# Patient Record
Sex: Female | Born: 1965 | Race: White | Hispanic: No | Smoking: Never smoker
Health system: Southern US, Community
[De-identification: ages and names within clinical notes are randomized; demographics above are authoritative.]

## PROBLEM LIST (undated history)

## (undated) DIAGNOSIS — Z9889 Other specified postprocedural states: Secondary | ICD-10-CM

## (undated) DIAGNOSIS — R112 Nausea with vomiting, unspecified: Secondary | ICD-10-CM

## (undated) DIAGNOSIS — O24419 Gestational diabetes mellitus in pregnancy, unspecified control: Secondary | ICD-10-CM

## (undated) HISTORY — DX: Gestational diabetes mellitus in pregnancy, unspecified control: O24.419

---

## 2004-12-31 ENCOUNTER — Ambulatory Visit: Payer: Self-pay

## 2005-01-08 ENCOUNTER — Ambulatory Visit: Payer: Self-pay

## 2005-02-08 ENCOUNTER — Ambulatory Visit: Payer: Self-pay

## 2005-03-10 ENCOUNTER — Ambulatory Visit: Payer: Self-pay

## 2005-07-10 ENCOUNTER — Inpatient Hospital Stay: Payer: Self-pay | Admitting: Obstetrics & Gynecology

## 2007-12-15 ENCOUNTER — Emergency Department: Payer: Self-pay | Admitting: Emergency Medicine

## 2008-05-17 ENCOUNTER — Emergency Department (HOSPITAL_COMMUNITY): Admission: EM | Admit: 2008-05-17 | Discharge: 2008-05-17 | Payer: Self-pay | Admitting: Emergency Medicine

## 2008-08-16 ENCOUNTER — Inpatient Hospital Stay: Payer: Self-pay | Admitting: Psychiatry

## 2008-09-05 ENCOUNTER — Inpatient Hospital Stay: Payer: Self-pay | Admitting: Psychiatry

## 2008-09-05 ENCOUNTER — Observation Stay: Payer: Self-pay | Admitting: Internal Medicine

## 2009-04-18 IMAGING — CR DG LUMBAR SPINE 2-3V
1 series · 3 of 3 positions shown · non-contrast
Comparison: none

REASON FOR EXAM: lower back pain
COMMENTS:

PROCEDURE:     DXR - DXR LUMBAR SPINE AP AND LATERAL  - December 15, 2007  [DATE]
RESULT:     There does not appear to be evidence of fracture, dislocation or
malalignment.  Degenerative changes are appreciated involving L2-3, L3-4 and
the posterior elements at L5-S1.

[Series 1: view not recorded · 0.17mm/px · 3 of 3 slices shown]
[im 1/3]
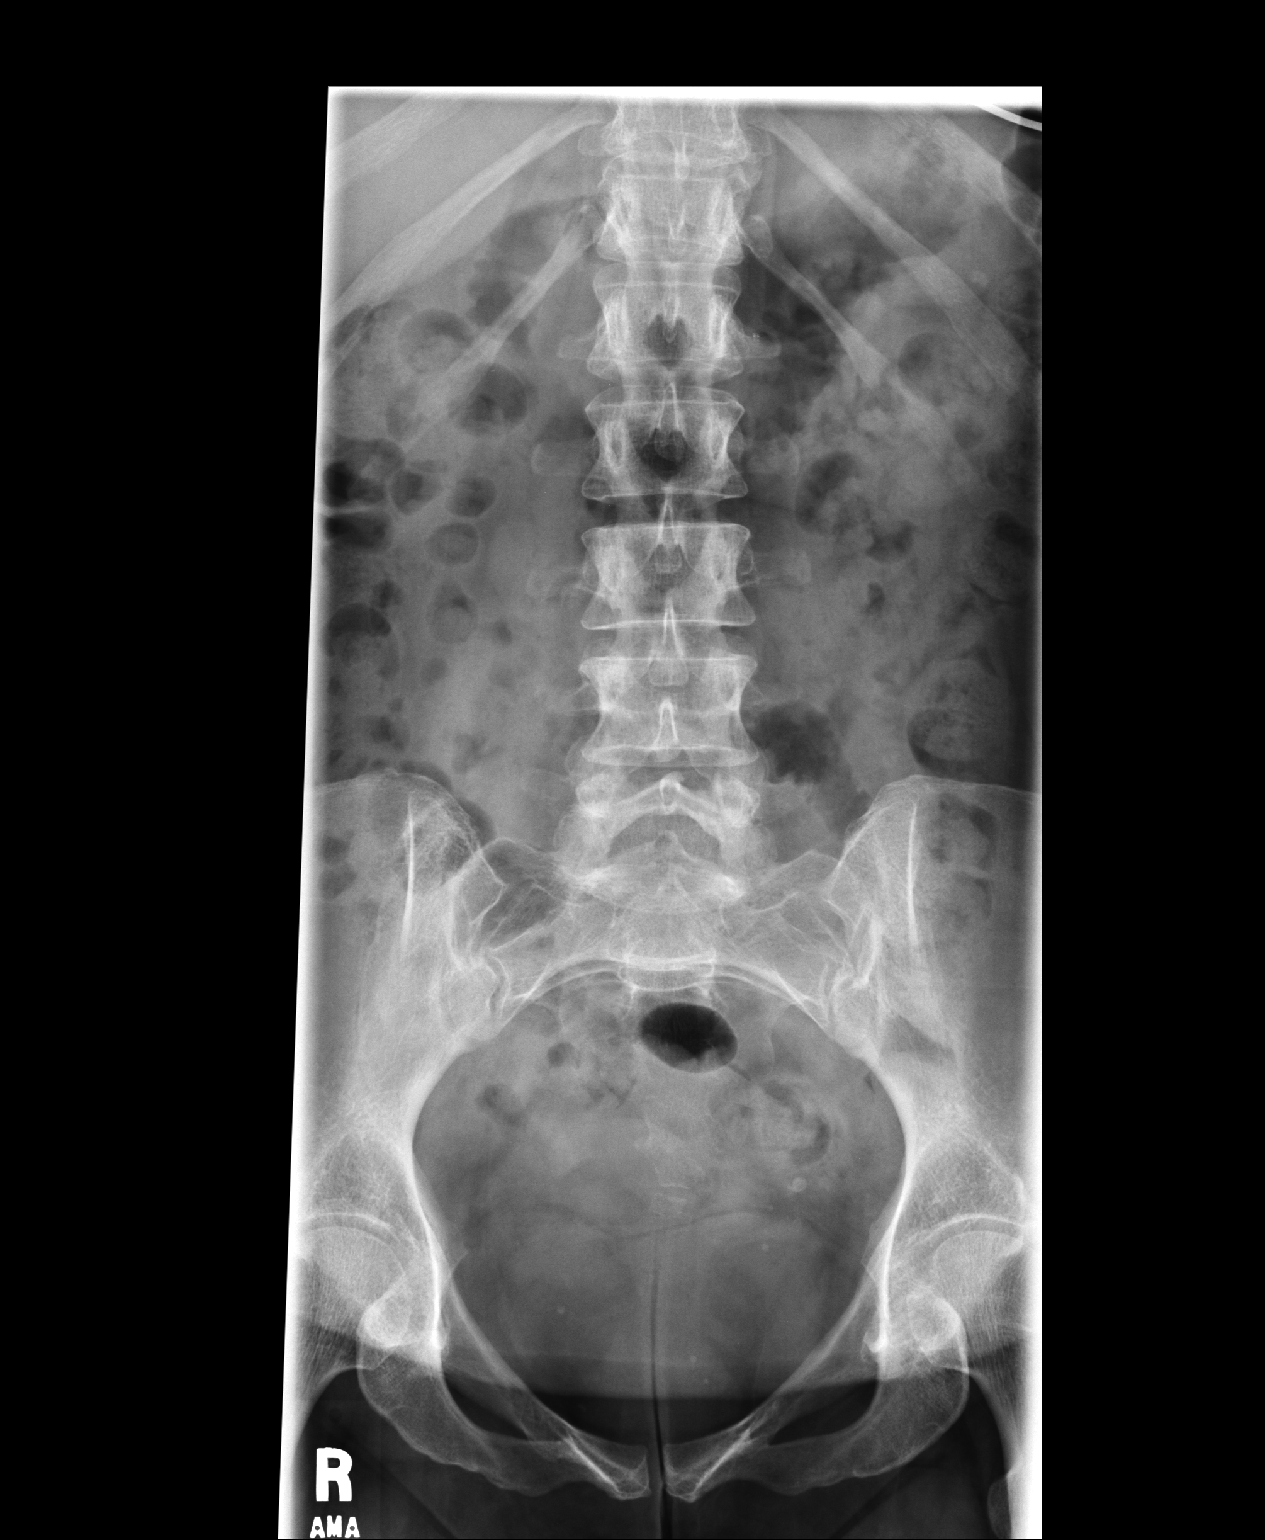
[im 2/3]
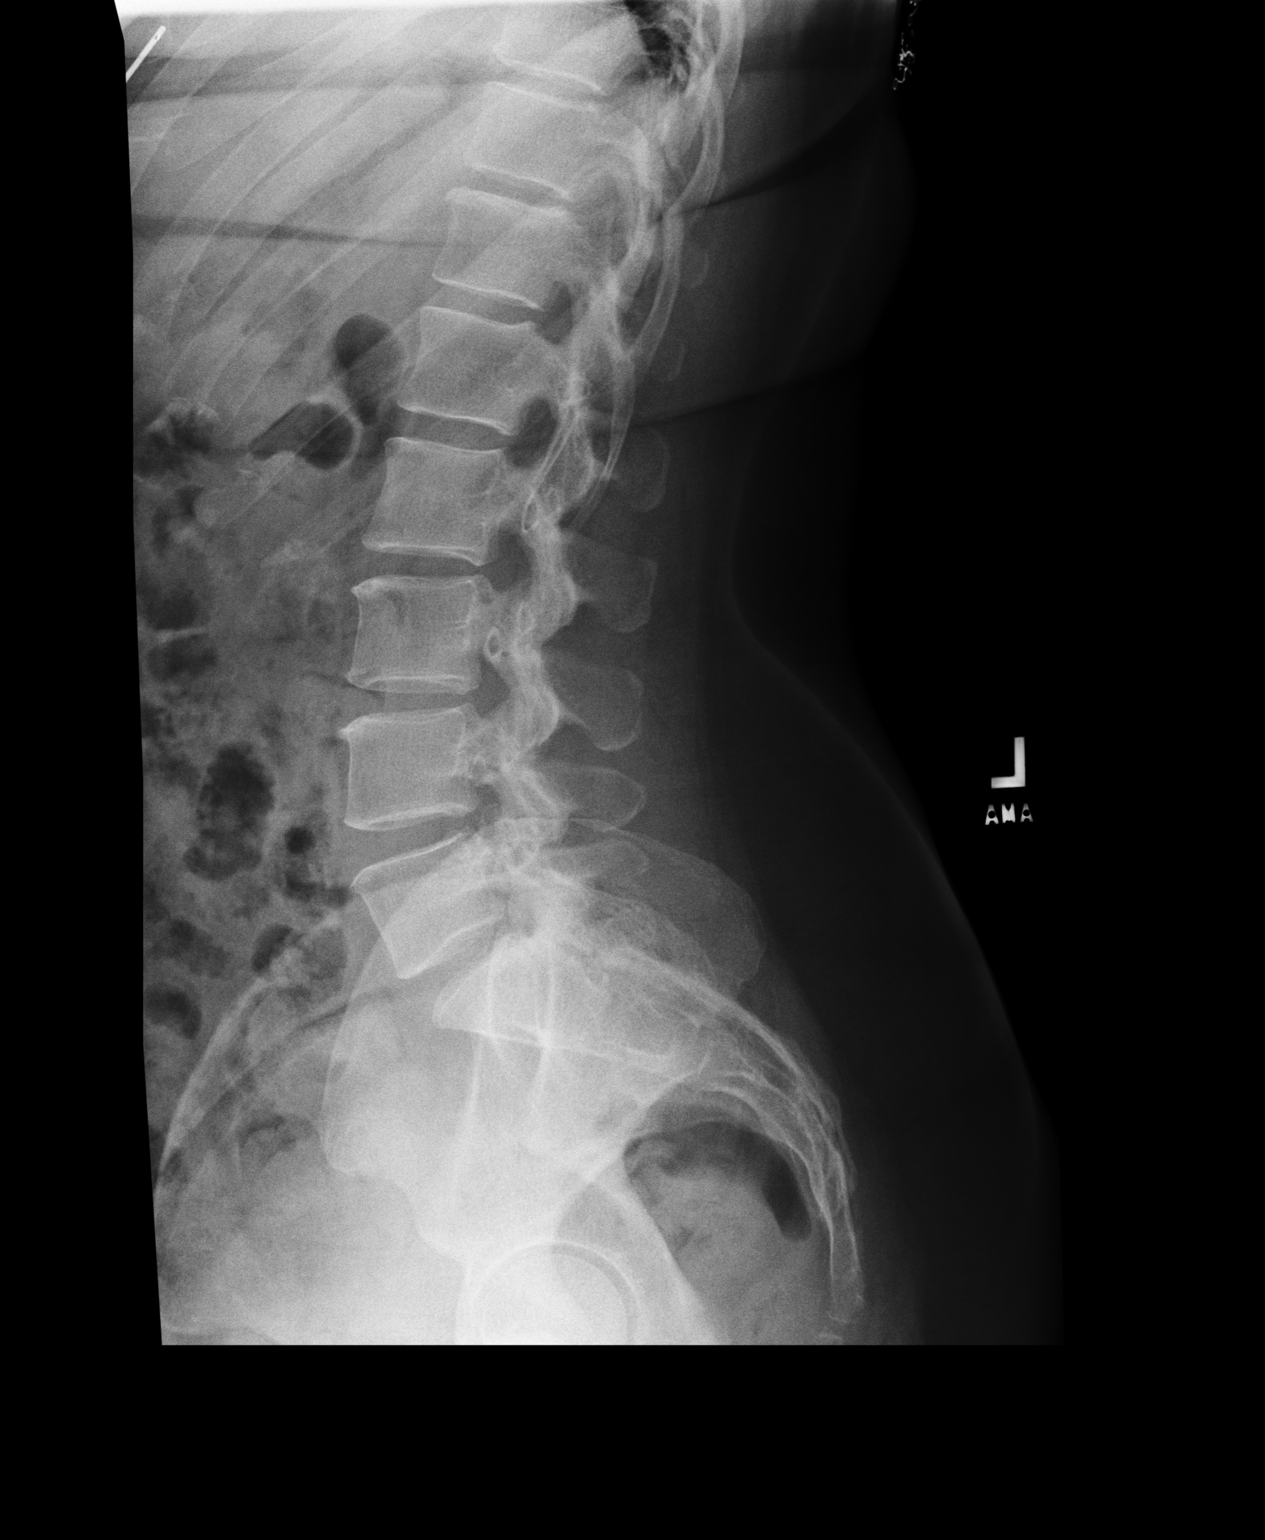
[im 3/3]
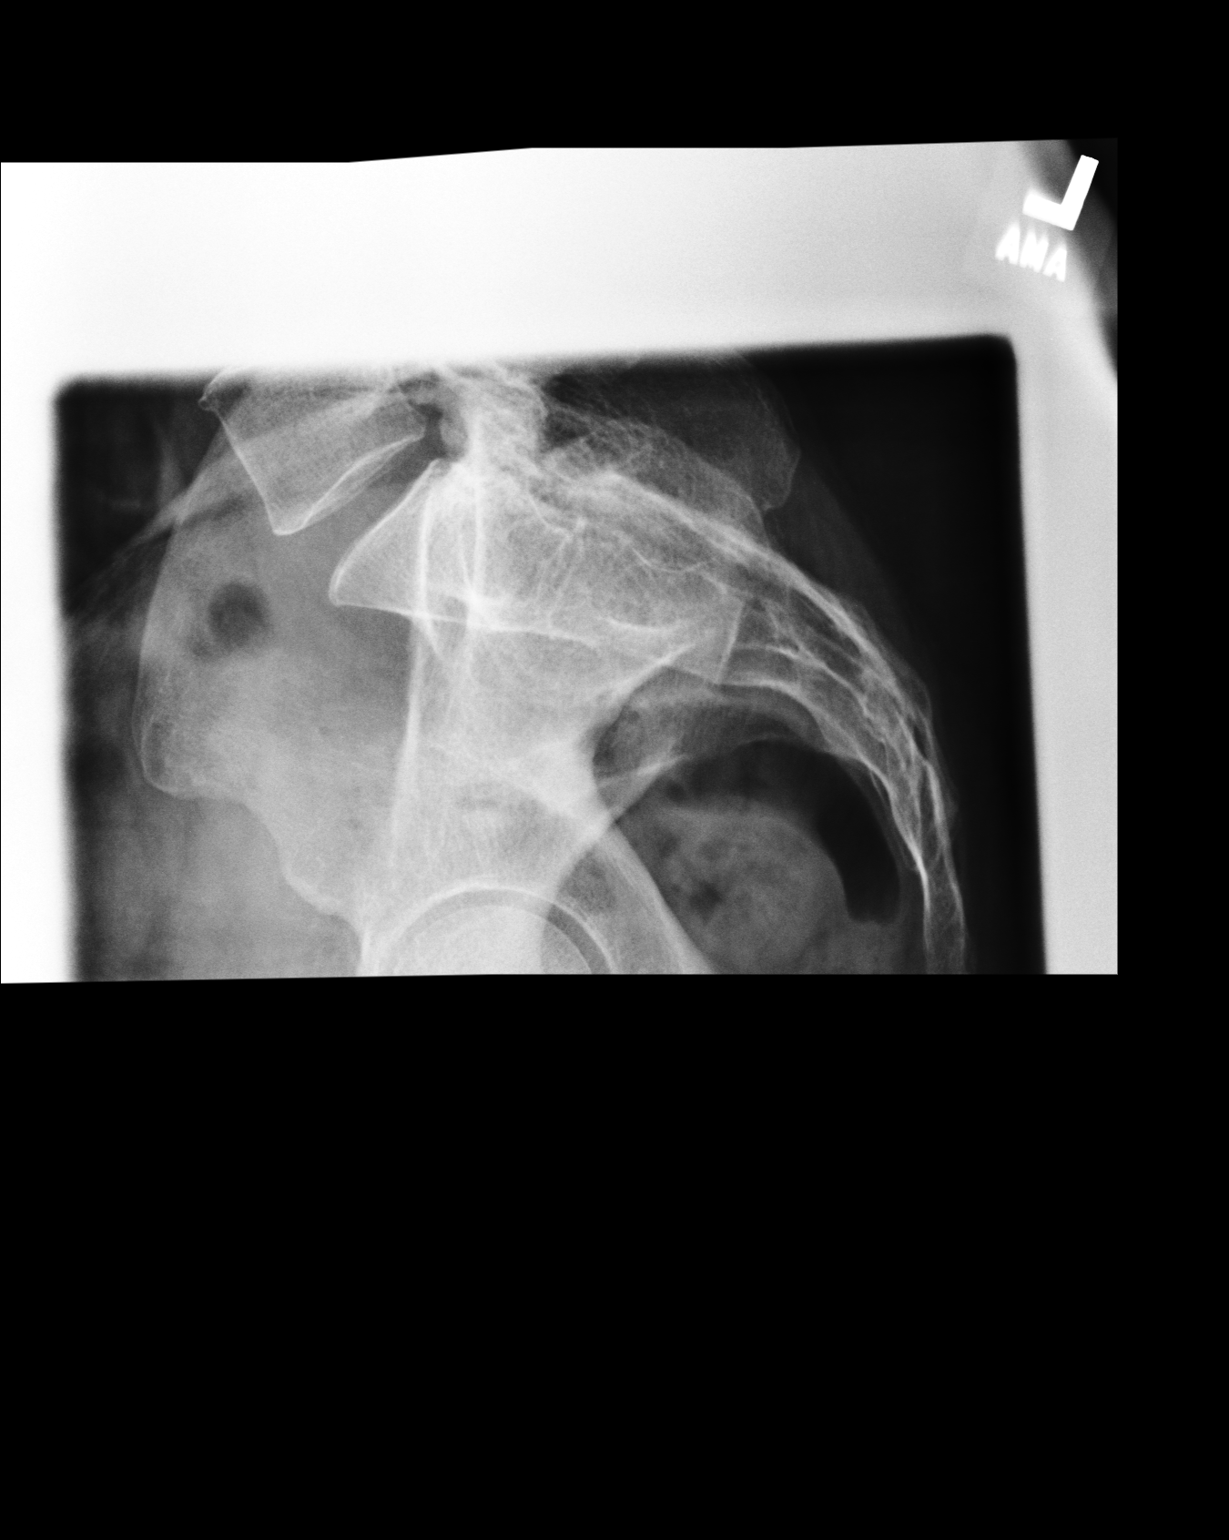

[3 of 3 positions shown; findings below may reference images not displayed]

IMPRESSION: No evidence of acute osseous abnormalities.  Degenerative changes are
appreciated.  If there is persistent clinical concern, further evaluation
with cross sectional imaging is recommended.

## 2009-09-16 ENCOUNTER — Inpatient Hospital Stay: Payer: Self-pay | Admitting: Internal Medicine

## 2009-09-17 ENCOUNTER — Inpatient Hospital Stay: Payer: Self-pay | Admitting: Unknown Physician Specialty

## 2010-09-07 ENCOUNTER — Emergency Department: Payer: Self-pay | Admitting: Unknown Physician Specialty

## 2011-01-19 IMAGING — CR DG CHEST 1V PORT
1 series · 1 of 1 positions shown · non-contrast
Comparison: none

REASON FOR EXAM: overdose
COMMENTS:

[view not recorded]
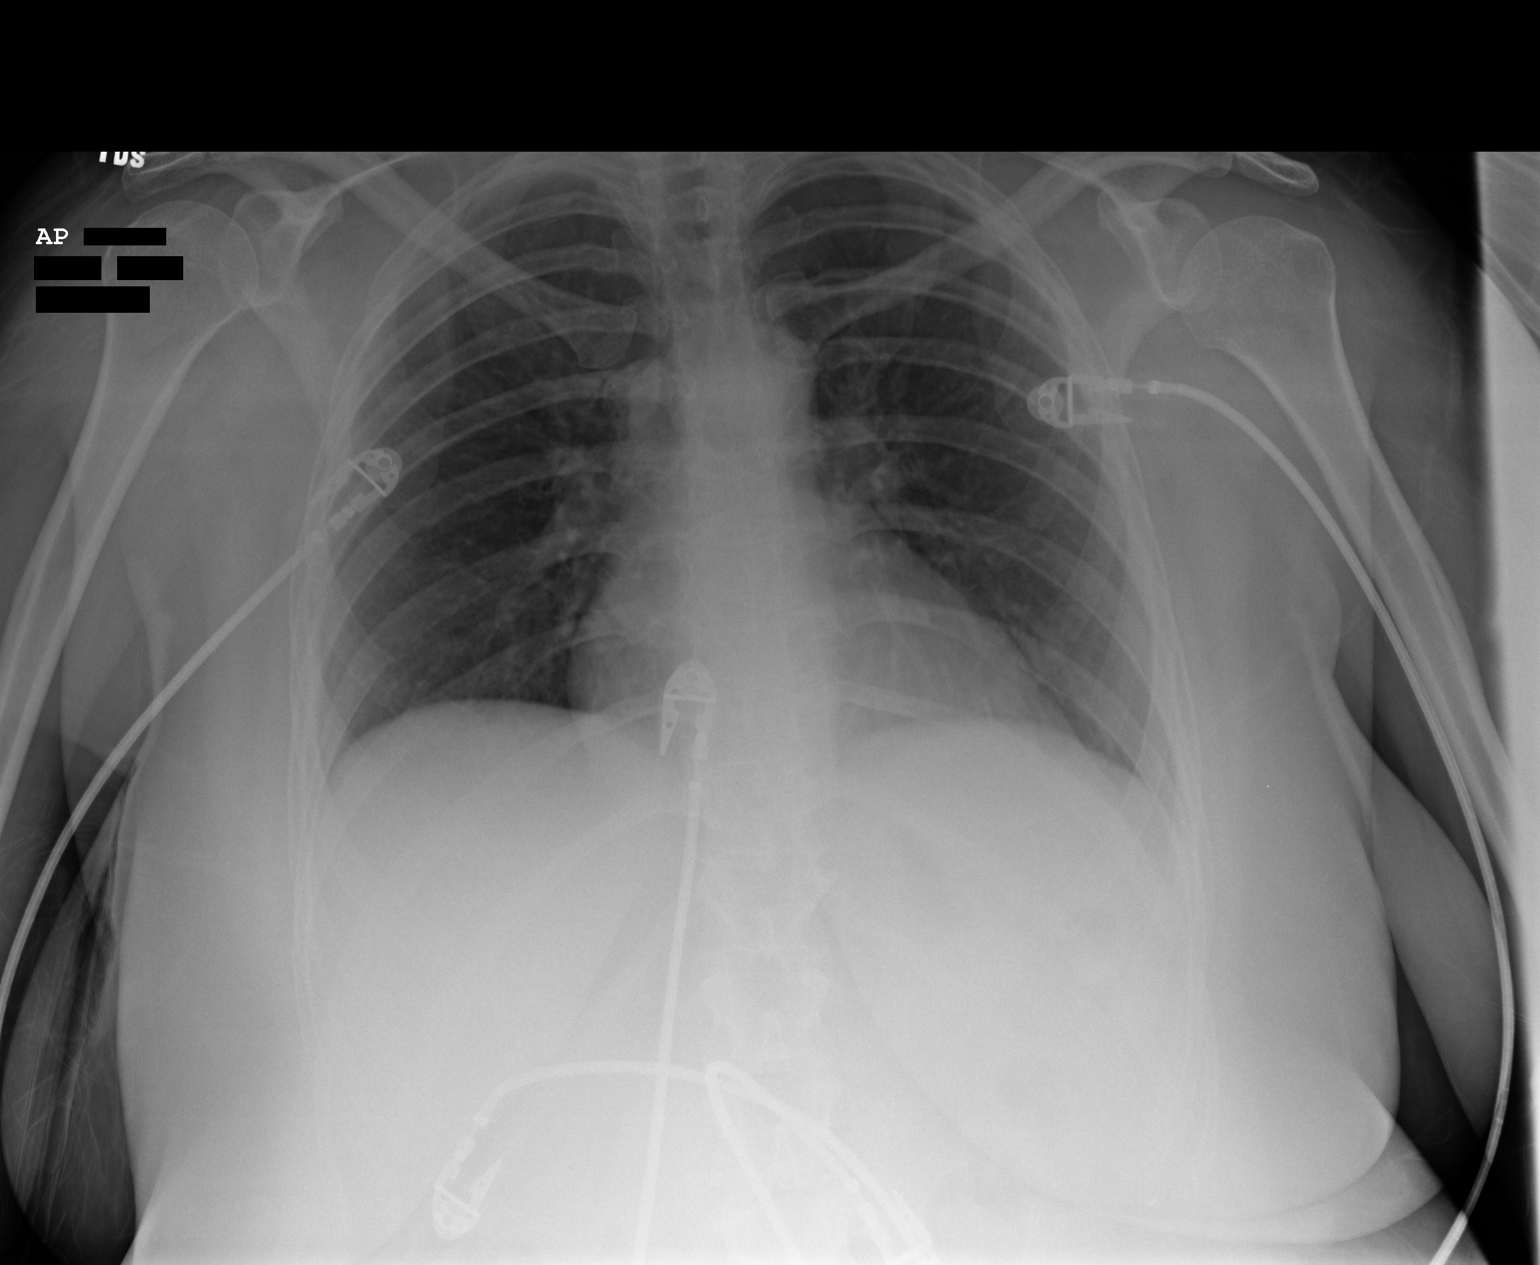

[1 of 1 positions shown; findings below may reference images not displayed]

PROCEDURE:     DXR - DXR PORTABLE CHEST SINGLE VIEW  - September 16, 2009  [DATE]

RESULT:     There is no previous examination for comparison.

Cardiac monitoring electrodes are present. The lungs are clear. The heart
and pulmonary vessels are normal. The bony and mediastinal structures are
unremarkable. There is no effusion. There is no pneumothorax or evidence of
congestive failure.
IMPRESSION: No acute cardiopulmonary disease.

## 2018-07-13 ENCOUNTER — Other Ambulatory Visit: Payer: Self-pay

## 2018-07-13 ENCOUNTER — Encounter: Payer: Self-pay | Admitting: Emergency Medicine

## 2018-07-13 ENCOUNTER — Ambulatory Visit
Admission: EM | Admit: 2018-07-13 | Discharge: 2018-07-13 | Disposition: A | Discharge status: Home / Self Care | Payer: Managed Care, Other (non HMO) | Attending: Family Medicine | Admitting: Family Medicine

## 2018-07-13 DIAGNOSIS — S39012A Strain of muscle, fascia and tendon of lower back, initial encounter: Secondary | ICD-10-CM

## 2018-07-13 DIAGNOSIS — M545 Low back pain: Secondary | ICD-10-CM

## 2018-07-13 MED ORDER — METAXALONE 800 MG PO TABS: 800.0000 mg | ORAL_TABLET | Freq: Three times a day (TID) | ORAL | 0 refills | Status: DC

## 2018-07-13 MED ORDER — KETOROLAC TROMETHAMINE 60 MG/2ML IM SOLN
30.0000 mg | Freq: Once | INTRAMUSCULAR | Status: AC
  Administered 2018-07-13: 30 mg via INTRAMUSCULAR

## 2018-07-13 MED ORDER — NAPROXEN 500 MG PO TABS: 500.0000 mg | ORAL_TABLET | Freq: Two times a day (BID) | ORAL | 0 refills | Status: DC

## 2018-07-13 NOTE — ED Triage Notes (Signed)
Patient c/o lower back pain that started 2 days ago.  Patient denies injury or falls.

## 2018-07-13 NOTE — Discharge Instructions (Signed)
Apply ice 20 minutes out of every 2 hours 4-5 times daily for comfort. Use  Caution while taking muscle relaxers.  Do not perform activities requiring concentration or judgment and do not drive.  Avoid symptoms .  Limit sitting lifting or bending.  Gradually resume activities as you improve

## 2018-07-13 NOTE — ED Provider Notes (Signed)
[facility]    CSN: [id number] Arrival date & time: 07/13/18  1412     History   Chief Complaint Chief Complaint  Patient presents with  . Back Pain    HPI Judith Barnes is a 52 y.o. female.   HPI  52 year old female presents with right-sided lower back pain indicating the sacroiliac area started about 2 days ago.  She does not remember any specific injury or fall that may account for her symptoms.  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  She denies any incontinence.  She has no radicular symptoms.  States that the pain has a burning quality to it.  Certain Motions seem to exacerbate it.  She states that at other times it is not bothersome when she sits or stands.  Certain motions seems to cause a sticking sensation.  He has no previous injury of significant back problems in the past although she has had short duration spasms that resolved without any reaction.          History reviewed. No pertinent past medical history.  There are no active problems to display for this patient.   Past Surgical History:  Procedure Laterality Date  . CESAREAN SECTION      OB History   None      Home Medications    Prior to Admission medications   Medication Sig Start Date End Date Taking? Authorizing Provider  metaxalone (SKELAXIN) 800 MG tablet Take 1 tablet (800 mg total) by mouth 3 (three) times daily. 07/13/18   Lutricia Feil, PA-C  naproxen (NAPROSYN) 500 MG tablet Take 1 tablet (500 mg total) by mouth 2 (two) times daily with a meal. 07/13/18   Lutricia Feil, PA-C    Family History History reviewed. No pertinent family history.  Social History Social History   Tobacco Use  . Smoking status: Never Smoker  . Smokeless tobacco: Never Used  Substance Use Topics  . Alcohol use: Never    Frequency: Never  . Drug use: Not on file     Allergies   Sulfa antibiotics   Review of Systems Review of Systems    Constitutional: Positive for activity change. Negative for appetite change, chills, fatigue and fever.  Musculoskeletal: Positive for back pain and myalgias.  All other systems reviewed and are negative.    Physical Exam Triage Vital Signs ED Triage Vitals  Enc Vitals Group     BP 07/13/18 1430 (!) 149/98     Pulse Rate 07/13/18 1430 85     Resp 07/13/18 1430 14     Temp 07/13/18 1430 98.3 F (36.8 C)     Temp Source 07/13/18 1430 Oral     SpO2 07/13/18 1430 99 %     Weight 07/13/18 1427 125 lb (56.7 kg)     Height 07/13/18 1427 4\' 10"  (1.473 m)     Head Circumference --      Peak Flow --      Pain Score 07/13/18 1426 6     Pain Loc --      Pain Edu? --      Excl. in GC? --    No data found.  Updated Vital Signs BP (!) 149/98 (BP Location: Left Arm)   Pulse 85   Temp 98.3 F (36.8 C) (Oral)   Resp 14   Ht 4\' 10"  (1.473 m)   Wt 125 lb (56.7 kg)   LMP 06/01/2018 (Approximate)  SpO2 99%   BMI 26.13 kg/m   Visual Acuity Right Eye Distance:   Left Eye Distance:   Bilateral Distance:    Right Eye Near:   Left Eye Near:    Bilateral Near:     Physical Exam  Constitutional: She is oriented to person, place, and time. She appears well-developed and well-nourished. No distress.  HENT:  Head: Normocephalic.  Eyes: Pupils are equal, round, and reactive to light. Right eye exhibits no discharge. Left eye exhibits no discharge.  Neck: Normal range of motion.  Musculoskeletal:  Exam of the lumbar spine was performed with Herbert Seta, RN chaperone and assistant.  Has a level pelvis in stance.  She has a slight forward list in stance.  Pain is across the lower lumbar segments at the lumbar sacroiliac junction.  The sacroiliac joints are not particularly tender to palpation.  And is able to forward flex only to her hands to the level of her mid thigh.  Repeat returning to upright posture is very difficult and takes several seconds.  Able to toe and heel walk normally.  Patient  is intact in the lower extremities.  EHL anterior tibialis and peroneal muscles are strong.  DTRs are brisk at 304 and symmetrical.  Leg raise testing is positive at 80 degrees on the right with low back pain and a burning sensation.  Leg raise testing is positive at 80 degrees on the left cross positive pain distribution.  Neurological: She is alert and oriented to person, place, and time.  Skin: Skin is warm and dry. She is not diaphoretic.  Psychiatric: She has a normal mood and affect. Her behavior is normal. Judgment and thought content normal.  Nursing note and vitals reviewed.    UC Treatments / Results  Labs (all labs ordered are listed, but only abnormal results are displayed) Labs Reviewed - No data to display  EKG None  Radiology No results found.  Procedures Procedures (including critical care time)  Medications Ordered in UC Medications  ketorolac (TORADOL) injection 30 mg (30 mg Intramuscular Given 07/13/18 1523)    Initial Impression / Assessment and Plan / UC Course  I have reviewed the triage vital signs and the nursing notes.  Pertinent labs & imaging results that were available during my care of the patient were reviewed by me and considered in my medical decision making (see chart for details).     Plan: 1. Test/x-ray results and diagnosis reviewed with patient 2. rx as per orders; risks, benefits, potential side effects reviewed with patient 3. Recommend supportive treatment with and symptom avoidance.  Avoid sitting lifting or bending.  Will start on Naprosyn 100 mg twice daily.  Also will add Skelaxin.  Precautions regarding muscle relaxers were given to the patient.  When she follow-up with her primary care physician if she is not improving within a week.  When her note for work for no heavy lifting for 7 days. 4. F/u prn if symptoms worsen or don't improve  Final Clinical Impressions(s) / UC Diagnoses   Final diagnoses:  Strain of lumbar region,  initial encounter     Discharge Instructions     Apply ice 20 minutes out of every 2 hours 4-5 times daily for comfort. Use  Caution while taking muscle relaxers.  Do not perform activities requiring concentration or judgment and do not drive.  Avoid symptoms .  Limit sitting lifting or bending.  Gradually resume activities as you improve    ED Prescriptions  Medication Sig Dispense Auth. Provider   naproxen (NAPROSYN) 500 MG tablet Take 1 tablet (500 mg total) by mouth 2 (two) times daily with a meal. 60 tablet Lutricia Feil, PA-C   metaxalone (SKELAXIN) 800 MG tablet Take 1 tablet (800 mg total) by mouth 3 (three) times daily. 21 tablet Lutricia Feil, PA-C     Controlled Substance Prescriptions [place] Controlled Substance Registry consulted? Not Applicable   Lutricia Feil, PA-C 07/13/18 1944

## 2018-07-20 ENCOUNTER — Encounter: Payer: Self-pay | Admitting: Family Medicine

## 2018-07-20 ENCOUNTER — Ambulatory Visit (INDEPENDENT_AMBULATORY_CARE_PROVIDER_SITE_OTHER): Payer: Managed Care, Other (non HMO) | Admitting: Family Medicine

## 2018-07-20 VITALS — BP 124/82 | HR 88 | Resp 16 | Ht 59.0 in | Wt 140.8 lb

## 2018-07-20 DIAGNOSIS — M21961 Unspecified acquired deformity of right lower leg: Secondary | ICD-10-CM | POA: Insufficient documentation

## 2018-07-20 DIAGNOSIS — S39012A Strain of muscle, fascia and tendon of lower back, initial encounter: Secondary | ICD-10-CM | POA: Insufficient documentation

## 2018-07-20 DIAGNOSIS — E663 Overweight: Secondary | ICD-10-CM | POA: Diagnosis not present

## 2018-07-20 DIAGNOSIS — Z1211 Encounter for screening for malignant neoplasm of colon: Secondary | ICD-10-CM

## 2018-07-20 DIAGNOSIS — M7712 Lateral epicondylitis, left elbow: Secondary | ICD-10-CM

## 2018-07-20 DIAGNOSIS — Z131 Encounter for screening for diabetes mellitus: Secondary | ICD-10-CM

## 2018-07-20 DIAGNOSIS — Z1212 Encounter for screening for malignant neoplasm of rectum: Secondary | ICD-10-CM

## 2018-07-20 DIAGNOSIS — Z1231 Encounter for screening mammogram for malignant neoplasm of breast: Secondary | ICD-10-CM

## 2018-07-20 DIAGNOSIS — S39012D Strain of muscle, fascia and tendon of lower back, subsequent encounter: Secondary | ICD-10-CM

## 2018-07-20 DIAGNOSIS — Z13220 Encounter for screening for lipoid disorders: Secondary | ICD-10-CM

## 2018-07-20 DIAGNOSIS — Z114 Encounter for screening for human immunodeficiency virus [HIV]: Secondary | ICD-10-CM

## 2018-07-20 DIAGNOSIS — Z1239 Encounter for other screening for malignant neoplasm of breast: Secondary | ICD-10-CM

## 2018-07-20 MED ORDER — MELOXICAM 15 MG PO TABS: 7.5000 mg | ORAL_TABLET | Freq: Every day | ORAL | 0 refills | Status: DC

## 2018-07-20 MED ORDER — TIZANIDINE HCL 4 MG PO TABS
4.0000 mg | ORAL_TABLET | Freq: Four times a day (QID) | ORAL | 0 refills | Status: DC | PRN
Start: 2018-07-20 — End: 2018-10-05

## 2018-07-20 NOTE — Patient Instructions (Addendum)
- Please go across the [facility] your [facility]. - You may go to [facility] on [address] between 1pm-7pm Monday through Friday for walk-in clinic if you back pain is not improving.  Low Back Sprain Rehab Ask your health care provider which exercises are safe for you. Do exercises exactly as told by your health care provider and adjust them as directed. It is normal to feel mild stretching, pulling, tightness, or discomfort as you do these exercises, but you should stop right away if you feel sudden pain or your pain gets worse. Do not begin these exercises until told by your health care provider. Stretching and range of motion exercises These exercises warm up your muscles and joints and improve the movement and flexibility of your back. These exercises also help to relieve pain, numbness, and tingling. Exercise A: Lumbar rotation  1. Lie on your back on a firm surface and bend your knees. 2. Straighten your arms out to your sides so each arm forms an "L" shape with a side of your body (a 90 degree angle). 3. Slowly move both of your knees to one side of your body until you feel a stretch in your lower back. Try not to let your shoulders move off of the floor. 4. Hold for __________ seconds. 5. Tense your abdominal muscles and slowly move your knees back to the starting position. 6. Repeat this exercise on the other side of your body. Repeat __________ times. Complete this exercise __________ times a day. Exercise B: Prone extension on elbows  1. Lie on your abdomen on a firm surface. 2. Prop yourself up on your elbows. 3. Use your arms to help lift your chest up until you feel a gentle stretch in your abdomen and your lower back. ? This will place some of your body weight on your elbows. If this is uncomfortable, try stacking pillows under your chest. ? Your hips should stay down, against the surface that you are lying on. Keep your hip and  back muscles relaxed. 4. Hold for __________ seconds. 5. Slowly relax your upper body and return to the starting position. Repeat __________ times. Complete this exercise __________ times a day. Strengthening exercises These exercises build strength and endurance in your back. Endurance is the ability to use your muscles for a long time, even after they get tired. Exercise C: Pelvic tilt 1. Lie on your back on a firm surface. Bend your knees and keep your feet flat. 2. Tense your abdominal muscles. Tip your pelvis up toward the ceiling and flatten your lower back into the floor. ? To help with this exercise, you may place a small towel under your lower back and try to push your back into the towel. 3. Hold for __________ seconds. 4. Let your muscles relax completely before you repeat this exercise. Repeat __________ times. Complete this exercise __________ times a day. Exercise D: Alternating arm and leg raises  1. Get on your hands and knees on a firm surface. If you are on a hard floor, you may want to use padding to cushion your knees, such as an exercise mat. 2. Line up your arms and legs. Your hands should be below your shoulders, and your knees should be below your hips. 3. Lift your left leg behind you. At the same time, raise your right arm and straighten it in front of you. ? Do not lift your leg higher than your hip. ?  Do not lift your arm higher than your shoulder. ? Keep your abdominal and back muscles tight. ? Keep your hips facing the ground. ? Do not arch your back. ? Keep your balance carefully, and do not hold your breath. 4. Hold for __________ seconds. 5. Slowly return to the starting position and repeat with your right leg and your left arm. Repeat __________ times. Complete this exercise __________ times a day. Exercise E: Abdominal set with straight leg raise  1. Lie on your back on a firm surface. 2. Bend one of your knees and keep your other leg  straight. 3. Tense your abdominal muscles and lift your straight leg up, 4-6 inches (10-15 cm) off the ground. 4. Keep your abdominal muscles tight and hold for __________ seconds. ? Do not hold your breath. ? Do not arch your back. Keep it flat against the ground. 5. Keep your abdominal muscles tense as you slowly lower your leg back to the starting position. 6. Repeat with your other leg. Repeat __________ times. Complete this exercise __________ times a day. Posture and body mechanics  Body mechanics refers to the movements and positions of your body while you do your daily activities. Posture is part of body mechanics. Good posture and healthy body mechanics can help to relieve stress in your body's tissues and joints. Good posture means that your spine is in its natural S-curve position (your spine is neutral), your shoulders are pulled back slightly, and your head is not tipped forward. The following are general guidelines for applying improved posture and body mechanics to your everyday activities. Standing   When standing, keep your spine neutral and your feet about hip-width apart. Keep a slight bend in your knees. Your ears, shoulders, and hips should line up.  When you do a task in which you stand in one place for a long time, place one foot up on a stable object that is 2-4 inches (5-10 cm) high, such as a footstool. This helps keep your spine neutral. Sitting   When sitting, keep your spine neutral and keep your feet flat on the floor. Use a footrest, if necessary, and keep your thighs parallel to the floor. Avoid rounding your shoulders, and avoid tilting your head forward.  When working at a desk or a computer, keep your desk at a height where your hands are slightly lower than your elbows. Slide your chair under your desk so you are close enough to maintain good posture.  When working at a computer, place your monitor at a height where you are looking straight ahead and you do  not have to tilt your head forward or downward to look at the screen. Resting   When lying down and resting, avoid positions that are most painful for you.  If you have pain with activities such as sitting, bending, stooping, or squatting (flexion-based activities), lie in a position in which your body does not bend very much. For example, avoid curling up on your side with your arms and knees near your chest (fetal position).  If you have pain with activities such as standing for a long time or reaching with your arms (extension-based activities), lie with your spine in a neutral position and bend your knees slightly. Try the following positions:  Lying on your side with a pillow between your knees.  Lying on your back with a pillow under your knees. Lifting   When lifting objects, keep your feet at least shoulder-width apart and tighten your  abdominal muscles.  Bend your knees and hips and keep your spine neutral. It is important to lift using the strength of your legs, not your back. Do not lock your knees straight out.  Always ask for help to lift heavy or awkward objects. This information is not intended to replace advice given to you by your health care provider. Make sure you discuss any questions you have with your health care provider. Document Released: 10/27/2005 Document Revised: 07/03/2016 Document Reviewed: 08/08/2015 Elsevier Interactive Patient Education  2018 [employer].  Tennis Elbow Tennis elbow is puffiness (inflammation) of the outer tendons of your forearm close to your elbow. Your tendons attach your muscles to your bones. Tennis elbow can happen in any sport or job in which you use your elbow too much. It is caused by doing the same motion over and over. Tennis elbow can cause:  Pain and tenderness in your forearm and the outer part of your elbow.  A burning feeling. This runs from your elbow through your arm.  Weak grip in your hands.  Follow these  instructions at home: Activity  Rest your elbow and wrist as told by your doctor. Try to avoid any activities that caused the problem until your doctor says that you can do them again.  If a physical therapist teaches you exercises, do all of them as told.  If you lift an object, lift it with your palm facing up. This is easier on your elbow. Lifestyle  If your tennis elbow is caused by sports, check your equipment and make sure that: ? You are using it correctly. ? It fits you well.  If your tennis elbow is caused by work, take breaks often, if you are able. Talk with your manager about doing your work in a way that is safe for you. ? If your tennis elbow is caused by computer use, talk with your manager about any changes that can be made to your work setup. General instructions  If told, apply ice to the painful area: ? Put ice in a plastic bag. ? Place a towel between your skin and the bag. ? Leave the ice on for 20 minutes, 2-3 times per day.  Take medicines only as told by your doctor.  If you were given a brace, wear it as told by your doctor.  Keep all follow-up visits as told by your doctor. This is important. Contact a doctor if:  Your pain does not get better with treatment.  Your pain gets worse.  You have weakness in your forearm, hand, or fingers.  You cannot feel your forearm, hand, or fingers. This information is not intended to replace advice given to you by your health care provider. Make sure you discuss any questions you have with your health care provider. Document Released: 04/16/2010 Document Revised: 06/26/2016 Document Reviewed: 10/23/2014 Elsevier Interactive Patient Education  2018 [employer].  Tennis Elbow Rehab Ask your health care provider which exercises are safe for you. Do exercises exactly as told by your health care provider and adjust them as directed. It is normal to feel mild stretching, pulling, tightness, or discomfort as you do  these exercises, but you should stop right away if you feel sudden pain or your pain gets worse. Do not begin these exercises until told by your health care provider. Stretching and range of motion exercises These exercises warm up your muscles and joints and improve the movement and flexibility of your elbow. These exercises also help to  relieve pain, numbness, and tingling. Exercise A: Wrist extensor stretch 7. Extend your left / right elbow with your fingers pointing down. 8. Gently pull the palm of your left / right hand toward you until you feel a gentle stretch on the top of your forearm. 9. To increase the stretch, push your left / right hand toward the outer edge or pinkie side of your forearm. 10. Hold this position for __________ seconds. Repeat __________ times. Complete this exercise __________ times a day. If directed by your health care provider, repeat this stretch except do it with a bent elbow this time. Exercise B: Wrist flexor stretch  1. Extend your left / right elbow and turn your palm upward. 2. Gently pull your left / right palm and fingertips back so your wrist extends and your fingers point more toward the ground. 3. You should feel a gentle stretch on the inside of your forearm. 4. Hold this position for __________ seconds. Repeat __________ times. Complete this exercise __________ times a day. If directed by your health care provider, repeat this stretch except do it with a bent elbow this time. Strengthening exercises These exercises build strength and endurance in your elbow. Endurance is the ability to use your muscles for a long time, even after they get tired. Exercise C: Wrist extensors  1. Sit with your left / right forearm palm-down and fully supported on a table or countertop. Your elbow should be resting below the height of your shoulder. 2. Let your left / right wrist extend over the edge of the surface. 3. Loosely hold a __________ weight or a piece of  rubber exercise band or tubing in your left / right hand. Slowly curl your left / right hand up toward your forearm. If you are using band or tubing, hold the band or tubing in place with your other hand to provide resistance. 4. Hold this position for __________ seconds. 5. Slowly return to the starting position. Repeat __________ times. Complete this exercise __________ times a day. Exercise D: Radial deviators  1. Stand with a __________ weight in your left / righthand. Or, sit while holding a rubber exercise band or tubing with your other arm supported on a table or countertop. Position your hand so your thumb is on top. 2. Raise your hand upward in front of you so your thumb travels toward your forearm, or pull up on the rubber tubing. 3. Hold this position for __________ seconds. 4. Slowly return to the starting position. Repeat __________ times. Complete this exercise __________ times a day. Exercise E: Eccentric wrist extensors 1. Sit with your left / right forearm palm-down and fully supported on a table or countertop. Your elbow should be resting below the height of your shoulder. 2. If told by your health care provider, hold a __________ weight in your hand. 3. Let your left / right wrist extend over the edge of the surface. 4. Use your other hand to lift up your left / right hand toward your forearm. Keep your forearm on the table. 5. Using only the muscles in your left / right hand, slowly lower your hand back down to the starting position. Repeat __________ times. Complete this exercise __________ times a day. This information is not intended to replace advice given to you by your health care provider. Make sure you discuss any questions you have with your health care provider. Document Released: 10/27/2005 Document Revised: 07/02/2016 Document Reviewed: 07/26/2015 Elsevier Interactive Patient Education  [employer].

## 2018-07-20 NOTE — Progress Notes (Signed)
Name: Judith Barnes   MRN: [id number]    DOB: 08-15-1966   Date:07/20/2018       Progress Note  Subjective  Chief Complaint  Chief Complaint  Patient presents with  . Establish Care  . Back Pain    seen at [facility]  . Cyst    on top of right foot and left elbow    HPI  Pt presents to establish care and for the following concerns:  Back Pain: She was seen in UC last week for burning back pain and spasm. Was given naproxen and metaxalone.  She had no known injury to the site, no radiation or weakness in the extremities.  Initially, her pain was improved, but then she returned to work and the pain returned, though it is now less severe than initially.  No more spasms. She was given naproxen and it is helping somewhat, but not completely.  She is taking skelaxin TID and this is improving the spasms. Also applying ice multiple times a day.   LEFT elbow swelling: Occurred suddenly about 2 weeks ago, she applied a compression bandage, and the swelling improved.   She is needing to lift heavy boxes more frequently now that she has switched jobs (works Engineering geologist - restocking, unloading truck deliveries)   RIGHT foot nodule: Has been present for a few months - she note this is on the dorsal surface in the middle of her right foot. She notes some swelling of the foot and some tenderness after working a long day and being on her feet for several hours.   Overweight: She notes her diet is horrible because she doesn't have time to eat properly or cook.  Her work schedule varies greatly.  She does eat vegetable but not often; she is not exercising regularly - she does have a gym membership.   There are no active problems to display for this patient.  Past Surgical History:  Procedure Laterality Date  . CESAREAN SECTION     3 cesarean   Family History  Problem Relation Age of Onset  . Diabetes Father   . Hypertension Father   . Graves' disease Sister   . Dementia Maternal  Grandmother     Social History   Socioeconomic History  . Marital status: Legally Separated    Spouse name: Not on file  . Number of children: 4  . Years of education: Not on file  . Highest education level: Not on file  Occupational History  . Occupation: Retail    Comment: [employer]  Social Needs  . Financial resource strain: Not hard at all  . Food insecurity:    Worry: Never true    Inability: Never true  . Transportation needs:    Medical: No    Non-medical: Not on file  Tobacco Use  . Smoking status: Never Smoker  . Smokeless tobacco: Never Used  Substance and Sexual Activity  . Alcohol use: Never    Frequency: Never  . Drug use: Never  . Sexual activity: Not Currently  Lifestyle  . Physical activity:    Days per week: 0 days    Minutes per session: 0 min  . Stress: To some extent  Relationships  . Social connections:    Talks on phone: Three times a week    Gets together: Never    Attends religious service: Never    Active member of club or organization: No    Attends meetings of clubs or organizations: Never  Relationship status: Separated  . Intimate partner violence:    Fear of current or ex partner: No    Emotionally abused: No    Physically abused: No    Forced sexual activity: No  Other Topics Concern  . Not on file  Social History Narrative  . Not on file     Current Outpatient Medications:  .  metaxalone (SKELAXIN) 800 MG tablet, Take 1 tablet (800 mg total) by mouth 3 (three) times daily., Disp: 21 tablet, Rfl: 0 .  naproxen (NAPROSYN) 500 MG tablet, Take 1 tablet (500 mg total) by mouth 2 (two) times daily with a meal., Disp: 60 tablet, Rfl: 0  Allergies  Allergen Reactions  . Sulfa Antibiotics Rash    ROS Ten systems reviewed and is negative except as mentioned in HPI.  Objective  Vitals:   07/20/18 1059  BP: 124/82  Pulse: 88  Resp: 16  SpO2: 96%  Weight: 140 lb 12.8 oz (63.9 kg)  Height: 4\' 11"  (1.499 m)   Body mass  index is 28.44 kg/m.  Physical Exam Constitutional: Patient appears well-developed and well-nourished. No distress.  HENT: Head: Normocephalic and atraumatic. Cardiovascular: Normal rate, regular rhythm and normal heart sounds.  No murmur heard. No BLE edema. Pulmonary/Chest: Effort normal and breath sounds normal. No respiratory distress. Abdominal: Soft. Bowel sounds are normal, no distension. There is no tenderness. no masses Musculoskeletal: Normal range of motion, no joint effusions. No gross deformities. LEFT elbow is non-tender, non-erythematous, and non-edematous.  RIGHT low back musculature is slightly tender to palpation; strength in all four extremities is 5/5. RIGHT dorsal foot exhibits small hard seemingly bony prominence over the 2nd-3rd metatarsals - non-tender, no erythema Neurological: she is alert and oriented to person, place, and time. No cranial nerve deficit. Coordination, balance, strength, speech and gait are normal.  Skin: Skin is warm and dry. No rash noted. No erythema.  Psychiatric: Patient has a normal mood and affect. behavior is normal. Judgment and thought content normal.  No results found for this or any previous visit (from the past 72 hour(s)).  PHQ2/9: Depression screen PHQ 2/9 07/20/2018  Decreased Interest 0  Down, Depressed, Hopeless 0  PHQ - 2 Score 0  Altered sleeping 0  Tired, decreased energy 0  Change in appetite 0  Feeling bad or failure about yourself  0  Trouble concentrating 0  Moving slowly or fidgety/restless 0  Suicidal thoughts 0  PHQ-9 Score 0  Difficult doing work/chores Not difficult at all   Fall Risk: Fall Risk  07/20/2018  Falls in the past year? No   Assessment & Plan  1. Strain of lumbar region, subsequent encounter - Discussed option for PT, Ortho referral, or self-rehab.  Exercises given for self- rehab and information for [facility] is provided. - meloxicam (MOBIC) 15 MG tablet; Take 0.5-1 tablets (7.5-15 mg total)  by mouth daily.  Dispense: 30 tablet; Refill: 0 - tiZANidine (ZANAFLEX) 4 MG tablet; Take 1 tablet (4 mg total) by mouth every 6 (six) hours as needed for muscle spasms.  Dispense: 30 tablet; Refill: 0  2. Lateral epicondylitis of left elbow - Rehab instructions provided.  Use good ergonomics at work. - meloxicam (MOBIC) 15 MG tablet; Take 0.5-1 tablets (7.5-15 mg total) by mouth daily.  Dispense: 30 tablet; Refill: 0  3. Deformity of right foot - DG Foot Complete Right; Future  4. Overweight (BMI 25.0-29.9) - TSH - Lipid panel - COMPLETE METABOLIC PANEL WITH GFR  5. Lipid screening -  Lipid panel  6. Diabetes mellitus screening - COMPLETE METABOLIC PANEL WITH GFR  7. Screening for HIV without presence of risk factors - HIV antibody

## 2018-07-24 LAB — COMPLETE METABOLIC PANEL WITH GFR
AG RATIO: 1.8 (calc) (ref 1.0–2.5)
ALKALINE PHOSPHATASE (APISO): 63 U/L (ref 33–130)
ALT: 32 U/L — ABNORMAL HIGH (ref 6–29)
AST: 22 U/L (ref 10–35)
Albumin: 4.4 g/dL (ref 3.6–5.1)
BILIRUBIN TOTAL: 0.6 mg/dL (ref 0.2–1.2)
BUN: 11 mg/dL (ref 7–25)
CO2: 26 mmol/L (ref 20–32)
CREATININE: 0.73 mg/dL (ref 0.50–1.05)
Calcium: 9.5 mg/dL (ref 8.6–10.4)
Chloride: 105 mmol/L (ref 98–110)
GFR, Est African American: 111 mL/min/{1.73_m2} (ref >=60)
GFR, Est Non African American: 95 mL/min/{1.73_m2} (ref >=60)
GLOBULIN: 2.4 g/dL (ref 1.9–3.7)
Glucose, Bld: 86 mg/dL (ref 65–99)
POTASSIUM: 4.3 mmol/L (ref 3.5–5.3)
SODIUM: 140 mmol/L (ref 135–146)
Total Protein: 6.8 g/dL (ref 6.1–8.1)

## 2018-07-24 LAB — LIPID PANEL
CHOL/HDL RATIO: 3.3 (calc) (ref <5.0)
CHOLESTEROL: 217 mg/dL — AB (ref <200)
HDL: 65 mg/dL (ref >50)
LDL Cholesterol (Calc): 132 mg/dL (calc) — ABNORMAL HIGH
Non-HDL Cholesterol (Calc): 152 mg/dL (calc) — ABNORMAL HIGH (ref <130)
TRIGLYCERIDES: 95 mg/dL (ref <150)

## 2018-07-24 LAB — HIV ANTIBODY (ROUTINE TESTING W REFLEX): HIV 1&2 Ab, 4th Generation: NONREACTIVE

## 2018-07-24 LAB — TSH: TSH: 2.69 m[IU]/L

## 2018-07-26 ENCOUNTER — Telehealth: Payer: Self-pay | Admitting: Family Medicine

## 2018-07-26 NOTE — Telephone Encounter (Signed)
Pt given results and recommendations per notes of Idolina Primermily Boyce,NP on 07/26/18. Pt verbalized understanding.Unable to document in result note due to result note not being routed to [facility].

## 2018-07-30 ENCOUNTER — Encounter: Payer: Self-pay | Admitting: Family Medicine

## 2018-07-30 ENCOUNTER — Ambulatory Visit (INDEPENDENT_AMBULATORY_CARE_PROVIDER_SITE_OTHER): Payer: Managed Care, Other (non HMO) | Admitting: Family Medicine

## 2018-07-30 ENCOUNTER — Other Ambulatory Visit (HOSPITAL_COMMUNITY)
Admission: RE | Admit: 2018-07-30 | Discharge: 2018-07-30 | Disposition: A | Discharge status: Home / Self Care | Payer: Managed Care, Other (non HMO) | Source: Ambulatory Visit | Attending: Family Medicine | Admitting: Family Medicine

## 2018-07-30 VITALS — BP 128/68 | HR 86 | Temp 98.1°F | Resp 14 | Ht 59.0 in | Wt 138.1 lb

## 2018-07-30 DIAGNOSIS — Z124 Encounter for screening for malignant neoplasm of cervix: Secondary | ICD-10-CM

## 2018-07-30 DIAGNOSIS — N3946 Mixed incontinence: Secondary | ICD-10-CM | POA: Diagnosis not present

## 2018-07-30 DIAGNOSIS — Z01419 Encounter for gynecological examination (general) (routine) without abnormal findings: Secondary | ICD-10-CM | POA: Insufficient documentation

## 2018-07-30 NOTE — Patient Instructions (Signed)

## 2018-07-30 NOTE — Progress Notes (Signed)
Name: Judith Barnes   MRN: [id number]    DOB: [birth year]   Date:07/30/2018       Progress Note  Subjective  Chief Complaint  Chief Complaint  Patient presents with  . Annual Exam    HPI  Patient presents for annual CPE.  Diet: Does not eat a lot - will have a very small breakfast; skips lunch; will sometimes cook, will sometimes eat out - tacos, spaghetti, [place] burger, she eats vegetables.  She eats out 1-2 times a week. Exercise: Not exercising  USPSTF grade A and B recommendations    Office Visit from 07/30/2018 in [facility]  AUDIT-C Score  0     Depression:  Depression screen [facility] 2/9 07/30/2018 07/20/2018  Decreased Interest 0 0  Down, Depressed, Hopeless - 0  PHQ - 2 Score 0 0  Altered sleeping 0 0  Tired, decreased energy 0 0  Change in appetite 0 0  Feeling bad or failure about yourself  0 0  Trouble concentrating 0 0  Moving slowly or fidgety/restless 0 0  Suicidal thoughts 0 0  PHQ-9 Score 0 0  Difficult doing work/chores Not difficult at all Not difficult at all   Hypertension: BP Readings from Last 3 Encounters:  07/30/18 128/68  07/20/18 124/82  07/13/18 (!) 149/98   Obesity: Wt Readings from Last 3 Encounters:  07/30/18 138 lb 1.6 oz (62.6 kg)  07/20/18 140 lb 12.8 oz (63.9 kg)  07/13/18 125 lb (56.7 kg)   BMI Readings from Last 3 Encounters:  07/30/18 27.89 kg/m  07/20/18 28.44 kg/m  07/13/18 26.13 kg/m    Hep C Screening: Declines today STD testing and prevention (HIV/chl/gon/syphilis): HIV negative; declines additional STI testing Intimate partner violence: No concerns today Sexual History/Pain during Intercourse: Denies pain with intercourse; has not been sexually active in several years Menstrual History/LMP/Abnormal Bleeding:  Is starting to have some irregularities in her period - missed last month; having some occasional hot flashes. Has tubes tied Incontinence Symptoms: Does endorse stress and urge  incontinence  Advanced Care Planning: A voluntary discussion about advance care planning including the explanation and discussion of advance directives.  Discussed health care proxy and Living will, and the patient was able to identify a health care proxy as Sister-in-Law - Burna Mortimer.  Patient does not have a living will at present time. If patient does have living will, I have requested they bring this to the clinic to be scanned in to their chart.  Breast cancer: This is ordered. No results found for: HMMAMMO  BRCA gene screening: Paternal Grandmother had breast cancer; no other females in the family; does not qualify at this time Cervical cancer screening: We will do today  Osteoporosis Screening: Discussed taking vitamin D supplmenet, weight bearing exercises.  No results found for: HMDEXASCAN  Lipids:  Lab Results  Component Value Date   CHOL 217 (H) 07/23/2018   Lab Results  Component Value Date   HDL 65 07/23/2018   Lab Results  Component Value Date   LDLCALC 132 (H) 07/23/2018   Lab Results  Component Value Date   TRIG 95 07/23/2018   Lab Results  Component Value Date   CHOLHDL 3.3 07/23/2018   No results found for: LDLDIRECT  Glucose:  Glucose, Bld  Date Value Ref Range Status  07/23/2018 86 65 - 99 mg/dL Final    Comment:    .            Fasting reference interval .  Skin cancer: No concerns today; doesn't go outside often, but does wear sunscreen when she goes to the beach Colorectal cancer: Referred for colonoscopy   ECG: Baseline EKG on file  Patient Active Problem List   Diagnosis Date Noted  . Overweight (BMI 25.0-29.9) 07/20/2018  . 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 Lateral epicondylitis of left elbow 07/20/2018  . Strain of lumbar region 07/20/2018    Past Surgical History:  Procedure Laterality Date  . CESAREAN SECTION     3 cesarean    Family History  Problem Relation Age of Onset  . Diabetes Father   . Hypertension Father    . Graves' disease Sister   . Dementia Maternal Grandmother     Social History   Socioeconomic History  . Marital status: Legally Separated    Spouse name: Not on file  . Number of children: 4  . Years of education: Not on file  . Highest education level: Not on file  Occupational History  . Occupation: Retail    Comment: [employer]  . Financial resource strain: Not hard at all  . Food insecurity:    Worry: Never true    Inability: Never true  . Transportation needs:    Medical: No    Non-medical: No  Tobacco Use  . Smoking status: Never Smoker  . Smokeless tobacco: Never Used  Substance and Sexual Activity  . Alcohol use: Never    Frequency: Never  . Drug use: Never  . Sexual activity: Not Currently    Partners: Male  Lifestyle  . Physical activity:    Days per week: 0 days    Minutes per session: 0 min  . Stress: To some extent  Relationships  . Social connections:    Talks on phone: Three times a week    Gets together: Never    Attends religious service: Never    Active member of club or organization: No    Attends meetings of clubs or organizations: Never    Relationship status: Separated  . Intimate partner violence:    Fear of current or ex partner: No    Emotionally abused: No    Physically abused: No    Forced sexual activity: No  Other Topics Concern  . Not on file  Social History Narrative   Lives with 67yo daughter and 39yo son     Current Outpatient Medications:  .  meloxicam (MOBIC) 15 MG tablet, Take 0.5-1 tablets (7.5-15 mg total) by mouth daily., Disp: 30 tablet, Rfl: 0 .  tiZANidine (ZANAFLEX) 4 MG tablet, Take 1 tablet (4 mg total) by mouth every 6 (six) hours as needed for muscle spasms., Disp: 30 tablet, Rfl: 0  Allergies  Allergen Reactions  . Sulfa Antibiotics Rash     ROS  Constitutional: Negative for fever or weight change.  Respiratory: Negative for cough and shortness of breath.   Cardiovascular: Negative  for chest pain or palpitations.  Gastrointestinal: Negative for abdominal pain, no bowel changes.  Musculoskeletal: Negative for gait problem or joint swelling.  Skin: Negative for rash.  Neurological: Negative for dizziness or headache.  No other specific complaints in a complete review of systems (except as listed in HPI above).  Objective  Vitals:   07/30/18 1306  BP: 128/68  Pulse: 86  Resp: 14  Temp: 98.1 F (36.7 C)  TempSrc: Oral  SpO2: 99%  Weight: 138 lb 1.6 oz (62.6 kg)  Height: '4\' 11"'  (1.499 m)  Body mass index is 27.89 kg/m.  Physical Exam  Constitutional: Patient appears well-developed and well-nourished. No distress.  HENT: Head: Normocephalic and atraumatic. Ears: B TMs ok, no erythema or effusion; Nose: Nose normal. Mouth/Throat: Oropharynx is clear and moist. No oropharyngeal exudate.  Eyes: Conjunctivae and EOM are normal. Pupils are equal, round, and reactive to light. No scleral icterus.  Neck: Normal range of motion. Neck supple. No JVD present. No thyromegaly present.  Cardiovascular: Normal rate, regular rhythm and normal heart sounds.  No murmur heard. No BLE edema. Pulmonary/Chest: Effort normal and breath sounds normal. No respiratory distress. Abdominal: Soft. Bowel sounds are normal, no distension. There is no tenderness. no masses Breast: no lumps or masses, no nipple discharge or rashes FEMALE GENITALIA:  External genitalia normal External urethra normal Vaginal vault normal without discharge or lesions Cervix normal without discharge or lesions Bimanual exam normal without masses RECTAL: no rectal masses or hemorrhoids Musculoskeletal: Normal range of motion, no joint effusions. No gross deformities Neurological: he is alert and oriented to person, place, and time. No cranial nerve deficit. Coordination, balance, strength, speech and gait are normal.  Skin: Skin is warm and dry. No rash noted. No erythema.  Psychiatric: Patient has a  normal mood and affect. behavior is normal. Judgment and thought content normal.  Recent Results (from the past 2160 hour(s))  TSH     Status: None   Collection Time: 07/23/18  8:25 AM  Result Value Ref Range   TSH 2.69 mIU/L    Comment:           Reference Range .           > or = 20 Years  0.40-4.50 .                Pregnancy Ranges           First trimester    0.26-2.66           Second trimester   0.55-2.73           Third trimester    0.43-2.91   Lipid panel     Status: Abnormal   Collection Time: 07/23/18  8:25 AM  Result Value Ref Range   Cholesterol 217 (H) <200 mg/dL   HDL 65 >50 mg/dL   Triglycerides 95 <150 mg/dL   LDL Cholesterol (Calc) 132 (H) mg/dL (calc)    Comment: Reference range: <100 . Desirable range <100 mg/dL for primary prevention;   <70 mg/dL for patients with CHD or diabetic patients  with > or = 2 CHD risk factors. Marland Kitchen LDL-C is now calculated using the Martin-Hopkins  calculation, which is a validated novel method providing  better accuracy than the Friedewald equation in the  estimation of LDL-C.  Cresenciano Genre et al. Annamaria Helling. 5462;703(50): 2061-2068  (http://education.[facility].com/faq/FAQ164)    Total CHOL/HDL Ratio 3.3 <5.0 (calc)   Non-HDL Cholesterol (Calc) 152 (H) <130 mg/dL (calc)    Comment: For patients with diabetes plus 1 major ASCVD risk  factor, treating to a non-HDL-C goal of <100 mg/dL  (LDL-C of <70 mg/dL) is considered a therapeutic  option.   COMPLETE METABOLIC PANEL WITH GFR     Status: Abnormal   Collection Time: 07/23/18  8:25 AM  Result Value Ref Range   Glucose, Bld 86 65 - 99 mg/dL    Comment: .            Fasting reference interval .    BUN 11 7 - 25 mg/dL  Creat 0.73 0.50 - 1.05 mg/dL    Comment: For patients >63 years of age, the reference limit for Creatinine is approximately 13% higher for people identified as African-American. .    GFR, Est Non African American 95 > OR = 60 mL/min/1.45m   GFR, Est  African American 111 > OR = 60 mL/min/1.745m  BUN/Creatinine Ratio NOT APPLICABLE 6 - 22 (calc)   Sodium 140 135 - 146 mmol/L   Potassium 4.3 3.5 - 5.3 mmol/L   Chloride 105 98 - 110 mmol/L   CO2 26 20 - 32 mmol/L   Calcium 9.5 8.6 - 10.4 mg/dL   Total Protein 6.8 6.1 - 8.1 g/dL   Albumin 4.4 3.6 - 5.1 g/dL   Globulin 2.4 1.9 - 3.7 g/dL (calc)   AG Ratio 1.8 1.0 - 2.5 (calc)   Total Bilirubin 0.6 0.2 - 1.2 mg/dL   Alkaline phosphatase (APISO) 63 33 - 130 U/L   AST 22 10 - 35 U/L   ALT 32 (H) 6 - 29 U/L  HIV antibody     Status: None   Collection Time: 07/23/18  8:25 AM  Result Value Ref Range   HIV 1&2 Ab, 4th Generation NON-REACTIVE NON-REACTI    Comment: HIV-1 antigen and HIV-1/HIV-2 antibodies were not detected. There is no laboratory evidence of HIV infection. . Marland KitchenLEASE NOTE: This information has been disclosed to you from records whose confidentiality may be protected by state law.  If your state requires such protection, then the state law prohibits you from making any further disclosure of the information without the specific written consent of the person to whom it pertains, or as otherwise permitted by law. A general authorization for the release of medical or other information is NOT sufficient for this purpose. . For additional information please refer to http://education.[facility].com/faq/FAQ106 (This link is being provided for informational/ educational purposes only.) . . Marland Kitchenhe performance of this assay has not been clinically validated in patients less than 2 25ears old. .      [facility] 07[facility] 07/20/2018  Decreased Interest 0 0  Down, Depressed, Hopeless - 0  PHQ - 2 Score 0 0  Altered sleeping 0 0  Tired, decreased energy 0 0  Change in appetite 0 0  Feeling bad or failure about yourself  0 0  Trouble concentrating 0 0  Moving slowly or fidgety/restless 0 0  Suicidal thoughts 0 0  PHQ-9 Score 0 0  Difficult  doing work/chores Not difficult at all Not difficult at all     Fall Risk: Fall Risk  07/30/2018 07/20/2018  Falls in the past year? No No   Assessment & Plan  1. Well woman exam with routine gynecological exam -USPSTF grade A and B recommendations reviewed with patient; age-appropriate recommendations, preventive care, screening tests, etc discussed and encouraged; healthy living encouraged; see AVS for patient education given to patient -Discussed importance of 150 minutes of physical activity weekly, eat two servings of fish weekly, eat one serving of tree nuts ( cashews, pistachios, pecans, almonds..)Marland Kitchenevery other day, eat 6 servings of fruit/vegetables daily and drink plenty of water and avoid sweet beverages.  - Cytology - PAP  2. Mixed stress and urge urinary incontinence - Ambulatory referral to Physical Therapy  3. Cervical cancer screening - Cytology - PAP

## 2018-08-02 LAB — CYTOLOGY - PAP: Diagnosis: NEGATIVE

## 2018-08-05 ENCOUNTER — Encounter: Payer: Self-pay | Admitting: *Deleted

## 2018-08-06 ENCOUNTER — Other Ambulatory Visit: Payer: Self-pay

## 2018-08-06 DIAGNOSIS — Z1211 Encounter for screening for malignant neoplasm of colon: Secondary | ICD-10-CM

## 2018-08-19 ENCOUNTER — Encounter: Payer: Self-pay | Admitting: Anesthesiology

## 2018-08-19 ENCOUNTER — Other Ambulatory Visit: Payer: Self-pay

## 2018-08-19 ENCOUNTER — Encounter: Payer: Self-pay | Admitting: *Deleted

## 2018-08-25 NOTE — Discharge Instructions (Signed)
General Anesthesia, Adult, Care After °These instructions provide you with information about caring for yourself after your procedure. Your health care provider may also give you more specific instructions. Your treatment has been planned according to current medical practices, but problems sometimes occur. Call your health care provider if you have any problems or questions after your procedure. °What can I expect after the procedure? °After the procedure, it is common to have: °· Vomiting. °· A sore throat. °· Mental slowness. ° °It is common to feel: °· Nauseous. °· Cold or shivery. °· Sleepy. °· Tired. °· Sore or achy, even in parts of your body where you did not have surgery. ° °Follow these instructions at home: °For at least 24 hours after the procedure: °· Do not: °? Participate in activities where you could fall or become injured. °? Drive. °? Use heavy machinery. °? Drink alcohol. °? Take sleeping pills or medicines that cause drowsiness. °? Make important decisions or sign legal documents. °? Take care of children on your own. °· Rest. °Eating and drinking °· If you vomit, drink water, juice, or soup when you can drink without vomiting. °· Drink enough fluid to keep your urine clear or pale yellow. °· Make sure you have little or no nausea before eating solid foods. °· Follow the diet recommended by your health care provider. °General instructions °· Have a responsible adult stay with you until you are awake and alert. °· Return to your normal activities as told by your health care provider. Ask your health care provider what activities are safe for you. °· Take over-the-counter and prescription medicines only as told by your health care provider. °· If you smoke, do not smoke without supervision. °· Keep all follow-up visits as told by your health care provider. This is important. °Contact a health care provider if: °· You continue to have nausea or vomiting at home, and medicines are not helpful. °· You  cannot drink fluids or start eating again. °· You cannot urinate after 8-12 hours. °· You develop a skin rash. °· You have fever. °· You have increasing redness at the site of your procedure. °Get help right away if: °· You have difficulty breathing. °· You have chest pain. °· You have unexpected bleeding. °· You feel that you are having a life-threatening or urgent problem. °This information is not intended to replace advice given to you by your health care provider. Make sure you discuss any questions you have with your health care provider. °Document Released: 02/02/2001 Document Revised: 03/31/2016 Document Reviewed: 10/11/2015 °Elsevier Interactive Patient Education © 2018 [employer] ° °

## 2018-08-27 ENCOUNTER — Ambulatory Visit
Admission: RE | Admit: 2018-08-27 | Payer: Managed Care, Other (non HMO) | Source: Ambulatory Visit | Admitting: Gastroenterology

## 2018-08-27 HISTORY — DX: Other specified postprocedural states: R11.2

## 2018-08-27 HISTORY — DX: Nausea with vomiting, unspecified: Z98.890

## 2018-08-27 SURGERY — COLONOSCOPY WITH PROPOFOL
Anesthesia: Choice

## 2018-10-05 ENCOUNTER — Telehealth: Payer: Self-pay | Admitting: Obstetrics and Gynecology

## 2018-10-05 ENCOUNTER — Encounter: Payer: Self-pay | Admitting: Nurse Practitioner

## 2018-10-05 ENCOUNTER — Ambulatory Visit (INDEPENDENT_AMBULATORY_CARE_PROVIDER_SITE_OTHER): Payer: Managed Care, Other (non HMO) | Admitting: Nurse Practitioner

## 2018-10-05 ENCOUNTER — Ambulatory Visit: Payer: Self-pay | Admitting: *Deleted

## 2018-10-05 ENCOUNTER — Other Ambulatory Visit: Payer: Self-pay | Admitting: Obstetrics and Gynecology

## 2018-10-05 VITALS — BP 126/82 | HR 95 | Temp 97.9°F | Resp 16 | Ht 59.0 in | Wt 137.8 lb

## 2018-10-05 DIAGNOSIS — N938 Other specified abnormal uterine and vaginal bleeding: Secondary | ICD-10-CM

## 2018-10-05 DIAGNOSIS — N939 Abnormal uterine and vaginal bleeding, unspecified: Secondary | ICD-10-CM

## 2018-10-05 LAB — CBC WITH DIFFERENTIAL/PLATELET
Basophils Absolute: 29 cells/uL (ref 0–200)
Basophils Relative: 0.5 %
EOS PCT: 1.2 %
Eosinophils Absolute: 70 cells/uL (ref 15–500)
HCT: 33.7 % — ABNORMAL LOW (ref 35.0–45.0)
Hemoglobin: 11.6 g/dL — ABNORMAL LOW (ref 11.7–15.5)
Lymphs Abs: 3022 cells/uL (ref 850–3900)
MCH: 32.3 pg (ref 27.0–33.0)
MCHC: 34.4 g/dL (ref 32.0–36.0)
MCV: 93.9 fL (ref 80.0–100.0)
MONOS PCT: 9 %
MPV: 10.4 fL (ref 7.5–12.5)
NEUTROS ABS: 2158 {cells}/uL (ref 1500–7800)
NEUTROS PCT: 37.2 %
PLATELETS: 298 10*3/uL (ref 140–400)
RBC: 3.59 10*6/uL — ABNORMAL LOW (ref 3.80–5.10)
RDW: 11.9 % (ref 11.0–15.0)
Total Lymphocyte: 52.1 %
WBC mixed population: 522 cells/uL (ref 200–950)
WBC: 5.8 10*3/uL (ref 3.8–10.8)

## 2018-10-05 NOTE — Telephone Encounter (Signed)
[facility] referring for urgent appointment for Abnormal vaginal bleeding. Called and left voicemail for patient to call back to be schedule

## 2018-10-05 NOTE — Telephone Encounter (Signed)
Patient is perimenopausal- she had skipped cycle  For 2 months and then started bleeding. She states she has been bleeding for 3 weeks- but the bleeding has picked up for the last 3 days and it is heavy with clots.   Reason for Disposition . MODERATE vaginal bleeding (i.e., soaking 1 pad or tampon per hour and present > 6 hours; 1 menstrual cup every 6 hours)  Answer Assessment - Initial Assessment Questions 1. AMOUNT: "Describe the bleeding that you are having."    - SPOTTING: spotting, or pinkish / brownish mucous discharge; does not fill panti-liner or pad    - MILD:  less than 1 pad / hour; less than patient's usual menstrual bleeding   - MODERATE: 1-2 pads / hour; 1 menstrual cup every 6 hours; small-medium blood clots (e.g., pea, grape, small coin)   - SEVERE: soaking 2 or more pads/hour for 2 or more hours; 1 menstrual cup every 2 hours; bleeding not contained by pads or continuous red blood from vagina; large blood clots (e.g., golf ball, large coin)      Moderate- severe- patient states she is using tampons and pads- she feels clots . The bleeding has gotten worse today 2. ONSET: "When did the bleeding begin?" "Is it continuing now?"     3rd week- 3rd day of heavy bleeding 3. MENSTRUAL PERIOD: "When was the last normal menstrual period?" "How is this different than your period?"     Missed 2 months of cycle- prolonged and heavy 4. REGULARITY: "How regular are your periods?"     Very regular and light- this was the first time she had skipped 5. ABDOMINAL PAIN: "Do you have any pain?" "How bad is the pain?"  (e.g., Scale 1-10; mild, moderate, or severe)   - MILD (1-3): doesn't interfere with normal activities, abdomen soft and not tender to touch    - MODERATE (4-7): interferes with normal activities or awakens from sleep, tender to touch    - SEVERE (8-10): excruciating pain, doubled over, unable to do any normal activities      Some cramping yesterday- mild 6. PREGNANCY: "Could you be  pregnant?" "Are you sexually active?" "Did you recently give birth?"     No-  Not active 7. BREASTFEEDING: "Are you breastfeeding?"     n/a 8. HORMONES: "Are you taking any hormone medications, prescription or OTC?" (e.g., birth control pills, estrogen)     no 9. BLOOD THINNERS: "Do you take any blood thinners?" (e.g., Coumadin/warfarin, Pradaxa/dabigatran, aspirin)     no 10. CAUSE: "What do you think is causing the bleeding?" (e.g., recent gyn surgery, recent gyn procedure; known bleeding disorder, cervical cancer, polycystic ovarian disease, fibroids)         no 11. HEMODYNAMIC STATUS: "Are you weak or feeling lightheaded?" If so, ask: "Can you stand and walk normally?"        No- drained 12. OTHER SYMPTOMS: "What other symptoms are you having with the bleeding?" (e.g., passed tissue, vaginal discharge, fever, menstrual-type cramps)       Heavy clots, headaches, feels warm  Protocols used: VAGINAL BLEEDING - ABNORMAL-A-AH

## 2018-10-05 NOTE — Patient Instructions (Signed)
-   Checking CBC to see if you are anemic - Referring you to GYN for further evaluation Abnormal Uterine Bleeding Abnormal uterine bleeding means bleeding more than usual from your uterus. It can include:  Bleeding between periods.  Bleeding after sex.  Bleeding that is heavier than normal.  Periods that last longer than usual.  Bleeding after you have stopped having your period (menopause).  There are many problems that may cause this. You should see a doctor for any kind of bleeding that is not normal. Treatment depends on the cause of the bleeding. Follow these instructions at home:  Watch your condition for any changes.  Do not use tampons, douche, or have sex, if your doctor tells you not to.  Change your pads often.  Get regular well-woman exams. Make sure they include a pelvic exam and cervical cancer screening.  Keep all follow-up visits as told by your doctor. This is important. Contact a doctor if:  The bleeding lasts more than one week.  You feel dizzy at times.  You feel like you are going to throw up (nauseous).  You throw up. Get help right away if:  You pass out.  You have to change pads every hour.  You have belly (abdominal) pain.  You have a fever.  You get sweaty.  You get weak.  You passing large blood clots from your vagina. Summary  Abnormal uterine bleeding means bleeding more than usual from your uterus.  There are many problems that may cause this. You should see a doctor for any kind of bleeding that is not normal.  Treatment depends on the cause of the bleeding. This information is not intended to replace advice given to you by your health care provider. Make sure you discuss any questions you have with your health care provider. Document Released: 08/24/2009 Document Revised: 10/21/2016 Document Reviewed: 10/21/2016 Elsevier Interactive Patient Education  2017 [employer]

## 2018-10-05 NOTE — Telephone Encounter (Signed)
error 

## 2018-10-05 NOTE — Progress Notes (Addendum)
Name: Judith Barnes   MRN: [id number]    DOB: 11/04/1966   Date:10/05/2018       Progress Note  Subjective  Chief Complaint  Chief Complaint  Patient presents with  . Menstrual Problem    Has been bleeding for about 3 weeks. The past 3 days it has been really heavy with clots. Fatigue and Headaches as well.    HPI  Had last period over 2 months ago, and started having normal bleeding for 2 weeks straight then having heavy bleeding- lots of clots. Has to change tampon every couple of hours.  Feeling tired having headaches. Initially started like a regular periods.  Mild abdominal cramping- not painful. Endorses fatigue ongoing for a couple of months;  Intermittent headaches- more frequent then normal.   Periods vary but can last up to 7 days, but last 3 days of spotting.  Not sexually active; years ago. No vaginal discharge.   Pap completed a few months ago- negative.   Patient Active Problem List   Diagnosis Date Noted  . Overweight (BMI 25.0-29.9) 07/20/2018  . 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 Lateral epicondylitis of left elbow 07/20/2018  . Strain of lumbar region 07/20/2018    Past Medical History:  Diagnosis Date  . Gestational diabetes   . PONV (postoperative nausea and vomiting)    after last 2 c-section    Past Surgical History:  Procedure Laterality Date  . CESAREAN SECTION     3 cesarean    Social History   Tobacco Use  . Smoking status: Never Smoker  . Smokeless tobacco: Never Used  Substance Use Topics  . Alcohol use: Never    Frequency: Never     Current Outpatient Medications:  .  Cholecalciferol (VITAMIN D PO), Take by mouth daily., Disp: , Rfl:   Allergies  Allergen Reactions  . Sulfa Antibiotics Rash    ROS  No other specific complaints in a complete review of systems (except as listed in HPI above).  Objective  Vitals:   10/05/18 1100 10/05/18 1134  BP: (!) 160/110 126/82  Pulse: 95   Resp: 16   Temp: 97.9 F (36.6 C)    TempSrc: Oral   SpO2: 95%   Weight: 137 lb 12.8 oz (62.5 kg)   Height: 4\' 11"  (1.499 m)     Body mass index is 27.83 kg/m.  Nursing Note and Vital Signs reviewed.  Physical Exam  Constitutional: She appears well-developed and well-nourished.  HENT:  Head: Normocephalic and atraumatic.  Cardiovascular: Normal rate, regular rhythm, normal heart sounds and intact distal pulses.  Pulmonary/Chest: Effort normal and breath sounds normal.  Abdominal: Soft. Bowel sounds are normal. There is no tenderness. There is no guarding.  Genitourinary: Pelvic exam was performed with patient prone. There is no rash, tenderness or lesion on the right labia. There is no rash, tenderness or lesion on the left labia. There is tenderness and bleeding (unable to identify cervix due to bleeding, no masses or clots noted upon exam. ) in the vagina. No erythema in the vagina. No foreign body in the vagina. No signs of injury around the vagina. No vaginal discharge found.  Skin: Skin is warm and dry.  Psychiatric: She has a normal mood and affect. Her behavior is normal.     No results found for this or any previous visit (from the past 48 hour(s)).  Assessment & Plan 1. Abnormal vaginal bleeding Recent TSH and pap were normal, no history of fibroids,  no abdominal tenderness or pain, not sexually active. Consider starting OCP, informed will refer to GYN and follow their recommendations.  - CBC with Differential - Ambulatory referral to Gynecology - hCG, serum, qualitative - patient declined.

## 2018-10-05 NOTE — Progress Notes (Signed)
Pt being referred for DUB. U/S with appt after.

## 2018-10-06 ENCOUNTER — Encounter: Payer: Self-pay | Admitting: Obstetrics and Gynecology

## 2018-10-06 ENCOUNTER — Ambulatory Visit (INDEPENDENT_AMBULATORY_CARE_PROVIDER_SITE_OTHER): Payer: Managed Care, Other (non HMO) | Admitting: Obstetrics and Gynecology

## 2018-10-06 ENCOUNTER — Ambulatory Visit (INDEPENDENT_AMBULATORY_CARE_PROVIDER_SITE_OTHER): Payer: Managed Care, Other (non HMO)

## 2018-10-06 VITALS — BP 130/90 | HR 88 | Ht 59.0 in | Wt 139.0 lb

## 2018-10-06 DIAGNOSIS — N938 Other specified abnormal uterine and vaginal bleeding: Secondary | ICD-10-CM | POA: Diagnosis not present

## 2018-10-06 DIAGNOSIS — N951 Menopausal and female climacteric states: Secondary | ICD-10-CM

## 2018-10-06 DIAGNOSIS — N939 Abnormal uterine and vaginal bleeding, unspecified: Secondary | ICD-10-CM | POA: Diagnosis not present

## 2018-10-06 DIAGNOSIS — D500 Iron deficiency anemia secondary to blood loss (chronic): Secondary | ICD-10-CM | POA: Diagnosis not present

## 2018-10-06 NOTE — Progress Notes (Signed)
[name], FNP   Chief Complaint  Patient presents with  . Abnormal bleeding    has been on cycle since the week of 11/4, had 3 days of really heavy bleeding (first period like this ever) before this one no cycle in 2 months and before that cycles every month    HPI:      [name] is a 52 y.o. [id number] who LMP was Patient's last menstrual period was 09/13/2018 (approximate)., presents today for NP eval of AUB this cycle, referred by PCP. Pt's menses usually monthly, lasting 5-7 days, light flow, no clots, no BTB or dysmen. Pt missed 9/19 and 10/19 menses. Started period 11/19 and had regular menses but then bleeding persisted. Had 3 days of heavy flow earlier this wk, but bleeding is tapering down now. Has had min cramping this past wk. Also having HA all month which is unusual for pt.  Neg pap and TSH labs with PCP 9/19. Slightly anemic with CBC yesterday. Pt eats meat but doesn't take MVI/Fe supp.   Pt is not sex active. No hx of HTN, DVTs, migraines.    Past Medical History:  Diagnosis Date  . Gestational diabetes   . PONV (postoperative nausea and vomiting)    after last 2 c-section    Past Surgical History:  Procedure Laterality Date  . CESAREAN SECTION     3 cesarean    Family History  Problem Relation Age of Onset  . Diabetes Father   . Hypertension Father   . Graves' disease Sister   . Dementia Maternal Grandmother   . Cancer Mother        polyp  . Breast cancer Paternal Grandmother     Social History   Socioeconomic History  . Marital status: Legally Separated    Spouse name: Not on file  . Number of children: 4  . Years of education: Not on file  . Highest education level: Not on file  Occupational History  . Occupation: Retail    Comment: [employer]  Social Needs  . Financial resource strain: Not hard at all  . Food insecurity:    Worry: Never true    Inability: Never true  . Transportation needs:    Medical: No    Non-medical:  No  Tobacco Use  . Smoking status: Never Smoker  . Smokeless tobacco: Never Used  Substance and Sexual Activity  . Alcohol use: Never    Frequency: Never  . Drug use: Never  . Sexual activity: Not Currently    Partners: Male  Lifestyle  . Physical activity:    Days per week: 0 days    Minutes per session: 0 min  . Stress: To some extent  Relationships  . Social connections:    Talks on phone: Three times a week    Gets together: Never    Attends religious service: Never    Active member of club or organization: No    Attends meetings of clubs or organizations: Never    Relationship status: Separated  . Intimate partner violence:    Fear of current or ex partner: No    Emotionally abused: No    Physically abused: No    Forced sexual activity: No  Other Topics Concern  . Not on file  Social History Narrative   Lives with 13yo daughter and 16yo son    Outpatient Medications Prior to Visit  Medication Sig Dispense Refill  . Cholecalciferol (VITAMIN D PO) Take  by mouth daily.     No facility-administered medications prior to visit.       ROS:  Review of Systems  Constitutional: Positive for fatigue. Negative for fever and unexpected weight change.  Respiratory: Negative for cough, shortness of breath and wheezing.   Cardiovascular: Negative for chest pain, palpitations and leg swelling.  Gastrointestinal: Negative for blood in stool, constipation, diarrhea, nausea and vomiting.  Endocrine: Negative for cold intolerance, heat intolerance and polyuria.  Genitourinary: Positive for menstrual problem. Negative for dyspareunia, dysuria, flank pain, frequency, genital sores, hematuria, pelvic pain, urgency, vaginal bleeding, vaginal discharge and vaginal pain.  Musculoskeletal: Negative for back pain, joint swelling and myalgias.  Skin: Negative for rash.  Neurological: Positive for headaches. Negative for dizziness, syncope, light-headedness and numbness.  Hematological:  Negative for adenopathy.  Psychiatric/Behavioral: Negative for agitation, confusion, sleep disturbance and suicidal ideas. The patient is not nervous/anxious.   BREAST: No symptoms   OBJECTIVE:   Vitals:  BP 130/90   Pulse 88   Ht 4\' 11"  (1.499 m)   Wt 139 lb (63 kg)   LMP 09/13/2018 (Approximate)   BMI 28.07 kg/m   Physical Exam  Constitutional: She is oriented to person, place, and time. Vital signs are normal. She appears well-developed.  Pulmonary/Chest: Effort normal.  Genitourinary: Vagina normal and uterus normal. There is no rash, tenderness or lesion on the right labia. There is no rash, tenderness or lesion on the left labia. Uterus is not enlarged and not tender. Cervix exhibits no motion tenderness. Right adnexum displays no mass and no tenderness. Left adnexum displays no mass and no tenderness. No erythema or tenderness in the vagina. No vaginal discharge found.  Genitourinary Comments: MIN BLEEDING  Musculoskeletal: Normal range of motion.  Neurological: She is alert and oriented to person, place, and time.  Psychiatric: She has a normal mood and affect. Her behavior is normal. Thought content normal.  Vitals reviewed.   Results:  ULTRASOUND REPORT  Location: [facility]  Date of Service: 10/06/2018   Patient Name: Judith DakinsJanice M Barnes DOB: 07/21/1966 MRN: [id number]  Indications:Dysfunctional uterine bleeding Findings:  The uterus is anteverted and measures 9.19 x 6.58 x 5.47 cm. Echo texture is heterogenous without evidence of focal masses.  The Endometrium measures 8.23 mm.  Right Ovary measures 2.13 x 1.61 x 1.27 cm. It is normal in appearance. Left Ovary measures 2.34 x 1.69 x 1.21 cm. It is normal in appearance. Survey of the adnexa demonstrates no adnexal masses. There is no free fluid in the cul de sac.  Impression: 1. Anteverted uterus with heterogenous texture.  Recommendations: 1.Clinical correlation with the patient's History and  Physical Exam.   Mital bahen Leodis BinetP Patel, RDMS  Assessment/Plan: Abnormal uterine bleeding (AUB) - This cycle. Sx resolving. Neg TSH/GYN u/s. Discussed watch and wait vs BC/IUD. Pt would like to follow for now. If sx persist, will check EMB before tx  Iron deficiency anemia due to chronic blood loss - Add  MVI with iron. F/u with PCP prn.  Perimenopause - Discussed normal vs abn sx. F/u prn DUB.  IUD handout given.   Return if symptoms worsen or fail to improve.  Desteni Piscopo B. Santita Hunsberger, PA-C 10/06/2018 4:40 PM

## 2018-10-06 NOTE — Patient Instructions (Signed)
I value your feedback and entrusting us with your care. If you get a [facility] patient survey, I would appreciate you taking the time to let us know about your experience today. Thank you! 

## 2019-01-13 ENCOUNTER — Ambulatory Visit
Admission: EM | Admit: 2019-01-13 | Discharge: 2019-01-13 | Disposition: A | Discharge status: Home / Self Care | Payer: Managed Care, Other (non HMO) | Attending: Family Medicine | Admitting: Family Medicine

## 2019-01-13 ENCOUNTER — Other Ambulatory Visit: Payer: Self-pay

## 2019-01-13 DIAGNOSIS — J069 Acute upper respiratory infection, unspecified: Secondary | ICD-10-CM | POA: Diagnosis not present

## 2019-01-13 MED ORDER — FLUTICASONE PROPIONATE 50 MCG/ACT NA SUSP: 2.0000 | Freq: Every day | NASAL | 0 refills | Status: DC

## 2019-01-13 MED ORDER — BENZONATATE 200 MG PO CAPS: ORAL_CAPSULE | ORAL | 0 refills | Status: DC

## 2019-01-13 MED ORDER — HYDROCOD POLST-CPM POLST ER 10-8 MG/5ML PO SUER: 5.0000 mL | Freq: Two times a day (BID) | ORAL | 0 refills | Status: DC

## 2019-01-13 NOTE — ED Triage Notes (Signed)
Patient complains of cough and sneezing and painful to breath. Patient states that symptoms started around 1 week ago.

## 2019-01-13 NOTE — Discharge Instructions (Addendum)
Drink plenty of fluids.  Rest as much as possible.  Use Tylenol or Motrin for fever chills or body aches.  °

## 2019-01-13 NOTE — ED Provider Notes (Signed)
[facility]    CSN: [id number] Arrival date & time: 01/13/19  1541     History   Chief Complaint Chief Complaint  Patient presents with  . Cough    HPI Judith Barnes is a 53 y.o. female.   HPI  53 year old female presents with a one-week history of allergy type symptoms and worsening of her symptoms approximately 3 days ago with cough sneezing and pain  over her anterior chest when breathing.  She is a never smoker.  She started taking Benadryl thinking that her initial symptoms were allergic and then has now gotten to the coughing and sneezing.  She did not receive a flu shot this year.  Temperature today is 99.3 pulse is 100 blood pressure 152/96 respirations 16 and O2 sats at 90% on room air.         Past Medical History:  Diagnosis Date  . Gestational diabetes   . PONV (postoperative nausea and vomiting)    after last 2 c-section    Patient Active Problem List   Diagnosis Date Noted  . Abnormal uterine bleeding (AUB) 10/06/2018  . Iron deficiency anemia due to chronic blood loss 10/06/2018  . Perimenopause 10/06/2018  . Overweight (BMI 25.0-29.9) 07/20/2018  . 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 Lateral epicondylitis of left elbow 07/20/2018  . Strain of lumbar region 07/20/2018    Past Surgical History:  Procedure Laterality Date  . CESAREAN SECTION     3 cesarean    OB History    Gravida  6   Para  4   Term  4   Preterm      AB      Living  4     SAB      TAB      Ectopic      Multiple      Live Births  4            Home Medications    Prior to Admission medications   Medication Sig Start Date End Date Taking? Authorizing Provider  Cholecalciferol (VITAMIN D PO) Take by mouth daily.   Yes [provider]  benzonatate (TESSALON) 200 MG capsule Take one cap TID PRN cough 01/13/19   Lutricia Feil, PA-C  chlorpheniramine-HYDROcodone [facility] ER) 10-8 MG/5ML SUER Take 5 mLs by mouth 2  (two) times daily. 01/13/19   Lutricia Feil, PA-C  fluticasone (FLONASE) 50 MCG/ACT nasal spray Place 2 sprays into both nostrils daily. 01/13/19   Lutricia Feil, PA-C    Family History Family History  Problem Relation Age of Onset  . Diabetes Father   . Hypertension Father   . Graves' disease Sister   . Dementia Maternal Grandmother   . Cancer Mother        polyp  . Breast cancer Paternal Grandmother     Social History Social History   Tobacco Use  . Smoking status: Never Smoker  . Smokeless tobacco: Never Used  Substance Use Topics  . Alcohol use: Never    Frequency: Never  . Drug use: Never     Allergies   Sulfa antibiotics   Review of Systems Review of Systems  Constitutional: Positive for activity change. Negative for appetite change, chills, fatigue and fever.  HENT: Positive for congestion, postnasal drip, sinus pressure and sinus pain.   Respiratory: Positive for cough.   All other systems reviewed and are negative.    Physical Exam Triage Vital Signs ED Triage  Vitals  Enc Vitals Group     BP 01/13/19 1605 (!) 152/96     Pulse Rate 01/13/19 1605 100     Resp 01/13/19 1605 16     Temp 01/13/19 1605 99.3 F (37.4 C)     Temp Source 01/13/19 1605 Oral     SpO2 01/13/19 1605 98 %     Weight 01/13/19 1604 145 lb (65.8 kg)     Height 01/13/19 1604 4' 10.62" (1.489 m)     Head Circumference --      Peak Flow --      Pain Score 01/13/19 1604 7     Pain Loc --      Pain Edu? --      Excl. in GC? --    No data found.  Updated Vital Signs BP (!) 152/96 (BP Location: Right Arm)   Pulse 100   Temp 99.3 F (37.4 C) (Oral)   Resp 16   Ht 4' 10.62" (1.489 m)   Wt 145 lb (65.8 kg)   SpO2 98%   BMI 29.67 kg/m   Visual Acuity Right Eye Distance:   Left Eye Distance:   Bilateral Distance:    Right Eye Near:   Left Eye Near:    Bilateral Near:     Physical Exam Vitals signs and nursing note reviewed.  Constitutional:      General: She is  not in acute distress.    Appearance: Normal appearance. She is not ill-appearing, toxic-appearing or diaphoretic.  HENT:     Head: Normocephalic and atraumatic.     Right Ear: Tympanic membrane, ear canal and external ear normal. There is no impacted cerumen.     Left Ear: Tympanic membrane, ear canal and external ear normal. There is no impacted cerumen.     Nose: Nose normal. No congestion or rhinorrhea.     Mouth/Throat:     Mouth: Mucous membranes are moist.     Pharynx: Oropharynx is clear. No oropharyngeal exudate or posterior oropharyngeal erythema.  Eyes:     General:        Right eye: No discharge.        Left eye: No discharge.     Conjunctiva/sclera: Conjunctivae normal.  Neck:     Musculoskeletal: Normal range of motion and neck supple.  Pulmonary:     Effort: Pulmonary effort is normal.     Breath sounds: Normal breath sounds.  Musculoskeletal: Normal range of motion.  Lymphadenopathy:     Cervical: No cervical adenopathy.  Skin:    General: Skin is warm and dry.  Neurological:     General: No focal deficit present.     Mental Status: She is alert and oriented to person, place, and time.  Psychiatric:        Mood and Affect: Mood normal.        Behavior: Behavior normal.        Thought Content: Thought content normal.        Judgment: Judgment normal.      UC Treatments / Results  Labs (all labs ordered are listed, but only abnormal results are displayed) Labs Reviewed - No data to display  EKG None  Radiology No results found.  Procedures Procedures (including critical care time)  Medications Ordered in UC Medications - No data to display  Initial Impression / Assessment and Plan / UC Course  I have reviewed the triage vital signs and the nursing notes.  Pertinent labs & imaging results  that were available during my care of the patient were reviewed by me and considered in my medical decision making (see chart for details).   Patient has an  upper respiratory infection.  She does not appear to have flulike symptoms at this time.  We will treat her with cough suppressants.  Also told her to use Flonase nasal spray.  If she worsens over the next day or so she should return to our clinic or go to the emergency room.   Final Clinical Impressions(s) / UC Diagnoses   Final diagnoses:  Acute upper respiratory infection     Discharge Instructions     Drink plenty of fluids.  Rest as much as possible.  Use Tylenol or Motrin for fever chills or body aches.    ED Prescriptions    Medication Sig Dispense Auth. Provider   chlorpheniramine-HYDROcodone (TUSSIONEX PENNKINETIC ER) 10-8 MG/5ML SUER Take 5 mLs by mouth 2 (two) times daily. 115 mL Ovid Curd P, PA-C   benzonatate (TESSALON) 200 MG capsule Take one cap TID PRN cough 30 capsule Ovid Curd P, PA-C   fluticasone (FLONASE) 50 MCG/ACT nasal spray Place 2 sprays into both nostrils daily. 16 g Lutricia Feil, PA-C     Controlled Substance Prescriptions [place] Controlled Substance Registry consulted? Not Applicable   Lutricia Feil, PA-C 01/13/19 1707

## 2019-08-30 ENCOUNTER — Encounter: Payer: Self-pay | Admitting: Emergency Medicine

## 2019-08-30 ENCOUNTER — Ambulatory Visit
Admission: EM | Admit: 2019-08-30 | Discharge: 2019-08-30 | Disposition: A | Discharge status: Home / Self Care | Payer: Managed Care, Other (non HMO) | Attending: Family Medicine | Admitting: Family Medicine

## 2019-08-30 ENCOUNTER — Other Ambulatory Visit: Payer: Self-pay

## 2019-08-30 DIAGNOSIS — R519 Headache, unspecified: Secondary | ICD-10-CM | POA: Diagnosis not present

## 2019-08-30 DIAGNOSIS — R110 Nausea: Secondary | ICD-10-CM

## 2019-08-30 DIAGNOSIS — R05 Cough: Secondary | ICD-10-CM

## 2019-08-30 DIAGNOSIS — R059 Cough, unspecified: Secondary | ICD-10-CM

## 2019-08-30 MED ORDER — KETOROLAC TROMETHAMINE 60 MG/2ML IM SOLN
60.0000 mg | Freq: Once | INTRAMUSCULAR | Status: AC
  Administered 2019-08-30: 60 mg via INTRAMUSCULAR

## 2019-08-30 MED ORDER — KETOROLAC TROMETHAMINE 10 MG PO TABS: 10.0000 mg | ORAL_TABLET | Freq: Three times a day (TID) | ORAL | 0 refills | Status: DC | PRN

## 2019-08-30 MED ORDER — ONDANSETRON 8 MG PO TBDP: 8.0000 mg | ORAL_TABLET | Freq: Three times a day (TID) | ORAL | 0 refills | Status: DC | PRN

## 2019-08-30 NOTE — ED Provider Notes (Signed)
[facility]    CSN: [id number] Arrival date & time: 08/30/19  0904      History   Chief Complaint No chief complaint on file.   HPI Judith Barnes is a 53 y.o. female.   53 yo female with a c/o headaches intermittently for the past week and cough for the past 3 days. Denies any fevers, chills, chest pains, shortness of breath, numbness/tingling, vision changes, vomiting. States has had some nausea. No known sick contacts.      Past Medical History:  Diagnosis Date  . Gestational diabetes   . PONV (postoperative nausea and vomiting)    after last 2 c-section    Patient Active Problem List   Diagnosis Date Noted  . Abnormal uterine bleeding (AUB) 10/06/2018  . Iron deficiency anemia due to chronic blood loss 10/06/2018  . Perimenopause 10/06/2018  . Overweight (BMI 25.0-29.9) 07/20/2018  . 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 Lateral epicondylitis of left elbow 07/20/2018  . Strain of lumbar region 07/20/2018    Past Surgical History:  Procedure Laterality Date  . CESAREAN SECTION     3 cesarean    OB History    Gravida  6   Para  4   Term  4   Preterm      AB      Living  4     SAB      TAB      Ectopic      Multiple      Live Births  4            Home Medications    Prior to Admission medications   Medication Sig Start Date End Date Taking? Authorizing Provider  benzonatate (TESSALON) 200 MG capsule Take one cap TID PRN cough 01/13/19   Lorin Picket, PA-C  chlorpheniramine-HYDROcodone [facility]) 10-8 MG/5ML SUER Take 5 mLs by mouth 2 (two) times daily. 01/13/19   Lorin Picket, PA-C  Cholecalciferol (VITAMIN D PO) Take by mouth daily.    [provider]  fluticasone (FLONASE) 50 MCG/ACT nasal spray Place 2 sprays into both nostrils daily. 01/13/19   Lorin Picket, PA-C  ketorolac (TORADOL) 10 MG tablet Take 1 tablet (10 mg total) by mouth every 8 (eight) hours as needed. 08/30/19    Norval Gable, MD  ondansetron (ZOFRAN ODT) 8 MG disintegrating tablet Take 1 tablet (8 mg total) by mouth every 8 (eight) hours as needed. 08/30/19   Norval Gable, MD    Family History Family History  Problem Relation Age of Onset  . Diabetes Father   . Hypertension Father   . Graves' disease Sister   . Dementia Maternal Grandmother   . Cancer Mother        polyp  . Breast cancer Paternal Grandmother     Social History Social History   Tobacco Use  . Smoking status: Never Smoker  . Smokeless tobacco: Never Used  Substance Use Topics  . Alcohol use: Never    Frequency: Never  . Drug use: Never     Allergies   Sulfa antibiotics   Review of Systems Review of Systems   Physical Exam Triage Vital Signs ED Triage Vitals  Enc Vitals Group     BP 08/30/19 0919 (!) 154/94     Pulse Rate 08/30/19 0919 87     Resp 08/30/19 0919 18     Temp 08/30/19 0919 98.3 F (36.8 C)     Temp  Source 08/30/19 0919 Oral     SpO2 08/30/19 0919 100 %     Weight 08/30/19 0916 140 lb (63.5 kg)     Height 08/30/19 0916 4\' 10"  (1.473 m)     Head Circumference --      Peak Flow --      Pain Score 08/30/19 0915 6     Pain Loc --      Pain Edu? --      Excl. in GC? --    No data found.  Updated Vital Signs BP (!) 154/94 (BP Location: Left Arm)   Pulse 87   Temp 98.3 F (36.8 C) (Oral)   Resp 18   Ht 4\' 10"  (1.473 m)   Wt 63.5 kg   LMP 08/11/2019   SpO2 100%   BMI 29.26 kg/m   Visual Acuity Right Eye Distance:   Left Eye Distance:   Bilateral Distance:    Right Eye Near:   Left Eye Near:    Bilateral Near:     Physical Exam Vitals signs and nursing note reviewed.  Constitutional:      General: She is not in acute distress.    Appearance: She is not toxic-appearing or diaphoretic.  Cardiovascular:     Rate and Rhythm: Normal rate and regular rhythm.     Pulses: Normal pulses.     Heart sounds: Normal heart sounds.  Pulmonary:     Effort: Pulmonary effort is  normal. No respiratory distress.     Breath sounds: Normal breath sounds. No stridor. No wheezing, rhonchi or rales.  Abdominal:     General: Bowel sounds are normal. There is no distension.     Palpations: Abdomen is soft. There is no mass.     Tenderness: There is no abdominal tenderness. There is no right CVA tenderness, left CVA tenderness, guarding or rebound.     Hernia: No hernia is present.  Neurological:     Mental Status: She is alert.      UC Treatments / Results  Labs (all labs ordered are listed, but only abnormal results are displayed) Labs Reviewed  NOVEL CORONAVIRUS, NAA (HOSP ORDER, SEND-OUT TO REF LAB; TAT 18-24 HRS)    EKG   Radiology No results found.  Procedures Procedures (including critical care time)  Medications Ordered in UC Medications  ketorolac (TORADOL) injection 60 mg (60 mg Intramuscular Given 08/30/19 1005)    Initial Impression / Assessment and Plan / UC Course  I have reviewed the triage vital signs and the nursing notes.  Pertinent labs & imaging results that were available during my care of the patient were reviewed by me and considered in my medical decision making (see chart for details).      Final Clinical Impressions(s) / UC Diagnoses   Final diagnoses:  Acute nonintractable headache, unspecified headache type  Cough  Nausea without vomiting    ED Prescriptions    Medication Sig Dispense Auth. Provider   ondansetron (ZOFRAN ODT) 8 MG disintegrating tablet Take 1 tablet (8 mg total) by mouth every 8 (eight) hours as needed. 6 tablet 10/11/2019, MD   ketorolac (TORADOL) 10 MG tablet Take 1 tablet (10 mg total) by mouth every 8 (eight) hours as needed. 15 tablet 09/01/19, MD     1. diagnosis reviewed with patient; patient given toradol 60mg  IM x 1 2. rx as per orders above; reviewed possible side effects, interactions, risks and benefits  3. Recommend supportive treatment with rest, fluids, otc  meds prn 4.  Follow-up prn if symptoms worsen or don't improve  PDMP not reviewed this encounter.   Payton Mccallumonty, Kerria Sapien, MD 08/30/19 [phone]

## 2019-08-30 NOTE — ED Triage Notes (Signed)
Patient in office Headache x1wk  Worsen over wkend  and cough.  Denies: Fever  [id number] and Ibuprofen

## 2019-08-31 LAB — NOVEL CORONAVIRUS, NAA (HOSP ORDER, SEND-OUT TO REF LAB; TAT 18-24 HRS): SARS-CoV-2, NAA: NOT DETECTED

## 2019-11-15 ENCOUNTER — Other Ambulatory Visit: Payer: Self-pay

## 2019-11-15 ENCOUNTER — Encounter: Payer: Self-pay | Admitting: Emergency Medicine

## 2019-11-15 ENCOUNTER — Ambulatory Visit
Admission: EM | Admit: 2019-11-15 | Discharge: 2019-11-15 | Disposition: A | Discharge status: Home / Self Care | Payer: HRSA Program | Attending: Emergency Medicine | Admitting: Emergency Medicine

## 2019-11-15 DIAGNOSIS — R110 Nausea: Secondary | ICD-10-CM | POA: Diagnosis not present

## 2019-11-15 DIAGNOSIS — Z20822 Contact with and (suspected) exposure to covid-19: Secondary | ICD-10-CM | POA: Diagnosis not present

## 2019-11-15 DIAGNOSIS — Z79899 Other long term (current) drug therapy: Secondary | ICD-10-CM | POA: Insufficient documentation

## 2019-11-15 DIAGNOSIS — R5383 Other fatigue: Secondary | ICD-10-CM | POA: Diagnosis not present

## 2019-11-15 DIAGNOSIS — R519 Headache, unspecified: Secondary | ICD-10-CM | POA: Diagnosis not present

## 2019-11-15 DIAGNOSIS — R05 Cough: Secondary | ICD-10-CM | POA: Diagnosis not present

## 2019-11-15 MED ORDER — DOXYCYCLINE HYCLATE 100 MG PO CAPS: 100.0000 mg | ORAL_CAPSULE | Freq: Two times a day (BID) | ORAL | 0 refills | Status: AC

## 2019-11-15 MED ORDER — TIZANIDINE HCL 4 MG PO TABS: 4.0000 mg | ORAL_TABLET | Freq: Three times a day (TID) | ORAL | 0 refills | Status: AC | PRN

## 2019-11-15 MED ORDER — DEXAMETHASONE SODIUM PHOSPHATE 10 MG/ML IJ SOLN
10.0000 mg | Freq: Once | INTRAMUSCULAR | Status: AC
  Administered 2019-11-15: 13:00:00 10 mg via INTRAMUSCULAR

## 2019-11-15 MED ORDER — KETOROLAC TROMETHAMINE 60 MG/2ML IM SOLN
30.0000 mg | Freq: Once | INTRAMUSCULAR | Status: AC
  Administered 2019-11-15: 13:00:00 30 mg via INTRAMUSCULAR

## 2019-11-15 MED ORDER — ONDANSETRON 8 MG PO TBDP: ORAL_TABLET | ORAL | 0 refills | Status: AC

## 2019-11-15 MED ORDER — IBUPROFEN 600 MG PO TABS: 600.0000 mg | ORAL_TABLET | Freq: Four times a day (QID) | ORAL | 0 refills | Status: DC | PRN

## 2019-11-15 MED ORDER — ONDANSETRON 8 MG PO TBDP
8.0000 mg | ORAL_TABLET | Freq: Once | ORAL | Status: AC
  Administered 2019-11-15: 8 mg via ORAL

## 2019-11-15 NOTE — ED Provider Notes (Signed)
HPI  SUBJECTIVE:  Judith Barnes is a 54 y.o. female who reports gradual onset intermittent frontal headache located behind her eyes.  This is been going on for 4 days.  Lasts hours to days.  She reports postnasal drip, fatigue, scratchy throat, cough, nausea, mild photophobia, phonophobia.  She states that one of her coworkers tested positive for Covid, last exposure was 4 days ago.  She states that her ears feel "muffled".  No body aches, no nasal congestion, loss of sense of taste or smell, fevers, shortness of breath, vomiting, diarrhea, abdominal pain, purulent nasal discharge.  No neck stiffness, ear, jaw, dental pain.  No seizures, syncope.  No arm or leg weakness, facial droop, slurred speech.  No visual loss, visual changes.  She has had similar headaches before which were thought to be migraines.  She has been alternating 1000 mg of Tylenol with 200 to 400 mg of ibuprofen every 4 hours with some relief.  Symptoms are worse with light and noise.  No ibuprofen today.  She took Tylenol within 4 to 6 hours of evaluation.  No antibiotics in the past month.  She has a past medical history of gestational diabetes, frequent sinus infections.  No history of atrial fibrillation, stroke, aneurysm, hypertension.  No formal diagnosis of migraines, coronary disease, chronic kidney disease, HIV, cancer, immunocompromise.  PMD: None.    Past Medical History:  Diagnosis Date  . Gestational diabetes   . PONV (postoperative nausea and vomiting)    after last 2 c-section    Past Surgical History:  Procedure Laterality Date  . CESAREAN SECTION     3 cesarean    Family History  Problem Relation Age of Onset  . Diabetes Father   . Hypertension Father   . Graves' disease Sister   . Dementia Maternal Grandmother   . Cancer Mother        polyp  . Breast cancer Paternal Grandmother     Social History   Tobacco Use  . Smoking status: Never Smoker  . Smokeless tobacco: Never Used  Substance  Use Topics  . Alcohol use: Never  . Drug use: Never    No current facility-administered medications for this encounter.  Current Outpatient Medications:  .  doxycycline (VIBRAMYCIN) 100 MG capsule, Take 1 capsule (100 mg total) by mouth 2 (two) times daily for 10 days., Disp: 20 capsule, Rfl: 0 .  ibuprofen (ADVIL) 600 MG tablet, Take 1 tablet (600 mg total) by mouth every 6 (six) hours as needed., Disp: 30 tablet, Rfl: 0 .  ondansetron (ZOFRAN ODT) 8 MG disintegrating tablet, 1/2- 1 tablet q 8 hr prn nausea, vomiting, Disp: 20 tablet, Rfl: 0 .  tiZANidine (ZANAFLEX) 4 MG tablet, Take 1 tablet (4 mg total) by mouth every 8 (eight) hours as needed for muscle spasms., Disp: 30 tablet, Rfl: 0  Allergies  Allergen Reactions  . Sulfa Antibiotics Rash     ROS  As noted in HPI.   Physical Exam  BP (!) 161/91 (BP Location: Left Arm)   Pulse 72   Temp 98 F (36.7 C) (Oral)   Resp 18   Ht 4\' 11"  (1.499 m)   Wt 59 kg   SpO2 97%   BMI 26.26 kg/m   Constitutional: Well developed, well nourished, no acute distress Eyes: PERRL, EOMI, conjunctiva normal bilaterally.  No direct or consensual photophobia. HENT: Normocephalic, atraumatic,mucus membranes moist, poor but nontender dentition.  TM normal b/l. No TMJ tenderness.  No nasal congestion,  positive maxillary and frontal sinus tenderness. No temporal artery tenderness.  Neck: no cervical LN + right-sided trapezial muscle tenderness, spasm. No meningismus Respiratory: normal inspiratory effort, lungs clear bilaterally Cardiovascular: Normal rate, regular rhythm GI:  nondistended skin: No rash, skin intact Musculoskeletal: No edema, no tenderness, no deformities Neurologic: Alert & oriented x 3, CN III-XII intact, romberg neg, finger-> nose, heel-> shin equal b/l, Romberg neg, tandem gait steady Psychiatric: Speech and behavior appropriate   ED Course  Medications  ketorolac (TORADOL) injection 30 mg (30 mg Intramuscular Given  11/15/19 1301)  ondansetron (ZOFRAN-ODT) disintegrating tablet 8 mg (8 mg Oral Given 11/15/19 1300)  dexamethasone (DECADRON) injection 10 mg (10 mg Intramuscular Given 11/15/19 1300)    Orders Placed This Encounter  Procedures  . Novel Coronavirus, NAA (Hosp order, Send-out to Ref Lab; TAT 18-24 hrs    Standing Status:   Standing    Number of Occurrences:   1    Order Specific Question:   Is this test for diagnosis or screening    Answer:   Diagnosis of ill patient    Order Specific Question:   Symptomatic for COVID-19 as defined by CDC    Answer:   Yes    Order Specific Question:   Date of Symptom Onset    Answer:   11/11/2019    Order Specific Question:   Hospitalized for COVID-19    Answer:   No    Order Specific Question:   Admitted to ICU for COVID-19    Answer:   No    Order Specific Question:   Previously tested for COVID-19    Answer:   Yes    Order Specific Question:   Resident in a congregate (group) care setting    Answer:   No    Order Specific Question:   Employed in healthcare setting    Answer:   No    Order Specific Question:   Pregnant    Answer:   No   No results found for this or any previous visit (from the past 24 hour(s)). No results found.   ED Clinical Impression  1. Acute nonintractable headache, unspecified headache type     ED Assessment/Plan  Pt describing typical pain, no sudden onset. Doubt SAH, ICH or space occupying lesion. Pt without fevers/chills, Pt has no meningeal sx, no nuchal rigidity. Doubt meningitis. Pt with normal neuro exam, no evidence of CVA/TIA.  Pt BP not elevated significantly, doubt hypertensive emergency. No evidence of temporal artery tenderness, no evidence of glaucoma or other ocular pathology. Will give headache cocktail dexamethasone 10 mg IM, Toradol 30 mg IM, Zofran 8 mg ODT.  Patient took Tylenol within 4 to 6 hours of evaluation.    On reevaluation, patient states she is ready to go home.  She has features suggestive of a  sinusitis and a migraine.  Seems to also have a musculoskeletalcomponent to it.  Covid PCR sent.  Will send home with ibuprofen 600 mg combined with 1000 mg of Tylenol 3-4 times a day as needed for pain, Zofran for nausea, Zanaflex 4 mg for the muscle spasm.  Wait-and-see prescription of doxycycline for presumed sinus infection if these medications do not help.  Denies purulent nasal drainage.  Will provide primary care list for routine care.  Discussed MDM, plan for follow up, signs and sx that should prompt return to ER. Pt agrees with plan  Meds ordered this encounter  Medications  . ketorolac (TORADOL) injection 30 mg  .  ondansetron (ZOFRAN-ODT) disintegrating tablet 8 mg  . dexamethasone (DECADRON) injection 10 mg  . tiZANidine (ZANAFLEX) 4 MG tablet    Sig: Take 1 tablet (4 mg total) by mouth every 8 (eight) hours as needed for muscle spasms.    Dispense:  30 tablet    Refill:  0  . ibuprofen (ADVIL) 600 MG tablet    Sig: Take 1 tablet (600 mg total) by mouth every 6 (six) hours as needed.    Dispense:  30 tablet    Refill:  0  . doxycycline (VIBRAMYCIN) 100 MG capsule    Sig: Take 1 capsule (100 mg total) by mouth 2 (two) times daily for 10 days.    Dispense:  20 capsule    Refill:  0  . ondansetron (ZOFRAN ODT) 8 MG disintegrating tablet    Sig: 1/2- 1 tablet q 8 hr prn nausea, vomiting    Dispense:  20 tablet    Refill:  0    *This clinic note was created using [employer]. Therefore, there may be occasional mistakes despite careful proofreading.  ?    Melynda Ripple, MD 11/16/19 [phone]

## 2019-11-15 NOTE — Discharge Instructions (Addendum)
Take 600 mg of ibuprofen combined with 1 g of Tylenol 3-4 times a day as needed for pain.  Zofran 3 times a day for nausea.  Push plenty of electrolyte containing fluids such as Pedialyte, Gatorade.  Zanaflex will help with your neck soreness.  Your Covid test will be back in 18 to 24 hours.  I am sending you home with a wait-and-see prescription of doxycycline, which is an antibiotic, for a presumed sinusitis if the ibuprofen Tylenol Zanaflex do not help.  Here is a list of primary care providers who are taking new patients:  Dr. Elizabeth Sauer, Dr. Schuyler Amor [address] [place] [address] [phone]  [facility] [address] [place] [address]  [phone]  [facility] [address] [place], [place] [address] [phone]  [facility] [address] [place]  [phone] [place], [place] [address]  Here are clinics/ other resources who will see you if you do not have insurance. Some have certain criteria that you must meet. Call them and find out what they are:  [facility]: [address] [place], [place] [address] Phone: [phone] Hours: First and Third Saturdays of each Month, 9 a.m. - 1 p.m.  [facility]: [address], [address] [place], [place] [address] Phone: [phone] Hours: Tuesday, 4 p.m. - 8 p.m. Thursday, 1 p.m. - 8 p.m. Wednesday, 9 a.m. - [facility] [address] [place], [place] [address] Phone: [phone] Pharmacy Phone Number: [phone] Dental Phone Number: [phone] [facility] Insurance Help: [phone]  Dental Hours: Monday - Thursday, 8 a.m. - 6 p.m.  [facility] [address], [place], [place] [address] Phone: [phone] Pharmacy Phone Number: [phone] [facility] Insurance Help: [phone]  [facility] [address] [place] [place], [place] [address] Phone: [phone] Pharmacy Phone Number:  [phone] [facility] Insurance Help: [phone]  [facility] [address] [place], [place] [address] Phone: [phone] [facility] Insurance Help: [phone]   [facility] [address], [place], [place] [address] Phone: [phone]  Go to www.goodrx.com to look up your medications. This will give you a list of where you can find your prescriptions at the most affordable prices. Or ask the pharmacist what the cash price is, or if they have any other discount programs available to help make your medication more affordable. This can be less expensive than what you would pay with insurance.

## 2019-11-15 NOTE — ED Triage Notes (Signed)
Pt c/o headache and scratchy throat. Started about 4 days ago. She states that she worked with someone that tested positive. Denies fever.

## 2019-11-16 LAB — NOVEL CORONAVIRUS, NAA (HOSP ORDER, SEND-OUT TO REF LAB; TAT 18-24 HRS): SARS-CoV-2, NAA: NOT DETECTED

## 2020-06-24 ENCOUNTER — Encounter: Payer: Self-pay | Admitting: Emergency Medicine

## 2020-06-24 ENCOUNTER — Ambulatory Visit
Admission: EM | Admit: 2020-06-24 | Discharge: 2020-06-24 | Disposition: A | Discharge status: Home / Self Care | Payer: Managed Care, Other (non HMO) | Attending: Internal Medicine | Admitting: Internal Medicine

## 2020-06-24 ENCOUNTER — Other Ambulatory Visit: Payer: Self-pay

## 2020-06-24 DIAGNOSIS — L03114 Cellulitis of left upper limb: Secondary | ICD-10-CM

## 2020-06-24 DIAGNOSIS — W540XXA Bitten by dog, initial encounter: Secondary | ICD-10-CM

## 2020-06-24 MED ORDER — AMOXICILLIN-POT CLAVULANATE 875-125 MG PO TABS: 1.0000 | ORAL_TABLET | Freq: Two times a day (BID) | ORAL | 0 refills | Status: AC

## 2020-06-24 MED ORDER — IBUPROFEN 600 MG PO TABS: 600.0000 mg | ORAL_TABLET | Freq: Four times a day (QID) | ORAL | 0 refills | Status: AC | PRN

## 2020-06-24 MED ORDER — KETOROLAC TROMETHAMINE 60 MG/2ML IM SOLN
30.0000 mg | Freq: Once | INTRAMUSCULAR | Status: AC
  Administered 2020-06-24: 30 mg via INTRAMUSCULAR

## 2020-06-24 NOTE — ED Triage Notes (Addendum)
Patient states that her niece's dog attacked her yesterday.  Patient states that the dog bit her right upper and lower arm yesterday.  Patient c/o pain, redness and swelling in her right arm.  Patient denies fevers.  Patient states that she does not want a Tdap vaccine today.  Patient states that she was told the dog is up to date on it's rabies vaccine.

## 2020-06-24 NOTE — Discharge Instructions (Addendum)
Daily dressing changes If you notice worsening swelling, purulent discharge, worsening pain, inability to flex your elbow-please return to the urgent care to be reevaluated.

## 2020-06-24 NOTE — ED Notes (Signed)
Animal Report given to [employer]

## 2020-06-24 NOTE — ED Provider Notes (Signed)
[facility]    CSN: [id number] Arrival date & time: 06/24/20  1324      History   Chief Complaint Chief Complaint  Patient presents with  . Animal Bite    HPI Judith Barnes is a 54 y.o. female comes to the urgent care with right elbow swelling which started yesterday.  She was attacked by a dog yesterday.  She sustained bite wounds to her right elbow area and the forearm as well.  The dog is domesticated dog and may have been a provoked bite.  Dog's immunization is up-to-date.  Dog belongs to her needs.  This morning she is experiencing worsening pain at the site of the bite, increasing redness, increasing blood-tinged discharge.  No fever or chills.  No numbness or tingling in the fingers.  She is able to make a fist.   HPI  Past Medical History:  Diagnosis Date  . Gestational diabetes   . PONV (postoperative nausea and vomiting)    after last 2 c-section    Patient Active Problem List   Diagnosis Date Noted  . Abnormal uterine bleeding (AUB) 10/06/2018  . Iron deficiency anemia due to chronic blood loss 10/06/2018  . Perimenopause 10/06/2018  . Overweight (BMI 25.0-29.9) 07/20/2018  . 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 Lateral epicondylitis of left elbow 07/20/2018  . Strain of lumbar region 07/20/2018    Past Surgical History:  Procedure Laterality Date  . CESAREAN SECTION     3 cesarean    OB History    Gravida  6   Para  4   Term  4   Preterm      AB      Living  4     SAB      TAB      Ectopic      Multiple      Live Births  4            Home Medications    Prior to Admission medications   Medication Sig Start Date End Date Taking? Authorizing Provider  amoxicillin-clavulanate (AUGMENTIN) 875-125 MG tablet Take 1 tablet by mouth every 12 (twelve) hours for 5 days. 06/24/20 06/29/20  Merrilee Jansky, MD  ibuprofen (ADVIL) 600 MG tablet Take 1 tablet (600 mg total) by mouth every 6 (six) hours as needed. 06/24/20    Merrilee Jansky, MD  ondansetron (ZOFRAN ODT) 8 MG disintegrating tablet 1/2- 1 tablet q 8 hr prn nausea, vomiting 11/15/19   Domenick Gong, MD  tiZANidine (ZANAFLEX) 4 MG tablet Take 1 tablet (4 mg total) by mouth every 8 (eight) hours as needed for muscle spasms. 11/15/19   Domenick Gong, MD  fluticasone (FLONASE) 50 MCG/ACT nasal spray Place 2 sprays into both nostrils daily. 01/13/19 11/15/19  Lutricia Feil, PA-C    Family History Family History  Problem Relation Age of Onset  . Diabetes Father   . Hypertension Father   . Graves' disease Sister   . Dementia Maternal Grandmother   . Cancer Mother        polyp  . Breast cancer Paternal Grandmother     Social History Social History   Tobacco Use  . Smoking status: Never Smoker  . Smokeless tobacco: Never Used  Vaping Use  . Vaping Use: Never used  Substance Use Topics  . Alcohol use: Never  . Drug use: Never     Allergies   Sulfa antibiotics   Review of Systems Review of  Systems  Constitutional: Negative for chills, fatigue and fever.  HENT: Negative.   Musculoskeletal: Positive for arthralgias and joint swelling. Negative for back pain and myalgias.  Skin: Positive for color change and wound.     Physical Exam Triage Vital Signs ED Triage Vitals  Enc Vitals Group     BP 06/24/20 1445 (!) 177/92     Pulse Rate 06/24/20 1445 78     Resp 06/24/20 1445 14     Temp 06/24/20 1445 98.3 F (36.8 C)     Temp Source 06/24/20 1445 Oral     SpO2 06/24/20 1445 99 %     Weight 06/24/20 1443 145 lb (65.8 kg)     Height 06/24/20 1443 4\' 10"  (1.473 m)     Head Circumference --      Peak Flow --      Pain Score 06/24/20 1443 7     Pain Loc --      Pain Edu? --      Excl. in GC? --    No data found.  Updated Vital Signs BP (!) 177/92 (BP Location: Left Arm)   Pulse 78   Temp 98.3 F (36.8 C) (Oral)   Resp 14   Ht 4\' 10"  (1.473 m)   Wt 65.8 kg   SpO2 99%   BMI 30.31 kg/m   Visual Acuity Right Eye  Distance:   Left Eye Distance:   Bilateral Distance:    Right Eye Near:   Left Eye Near:    Bilateral Near:     Physical Exam Vitals and nursing note reviewed.  Cardiovascular:     Rate and Rhythm: Normal rate and regular rhythm.     Pulses: Normal pulses.     Heart sounds: Normal heart sounds.  Pulmonary:     Effort: Pulmonary effort is normal.     Breath sounds: Normal breath sounds.  Musculoskeletal:     Comments: Limited range of motion around the right elbow.  Tenderness, erythema and swelling of the right elbow as well as the forearm.  Skin:    General: Skin is warm.     Findings: Erythema and lesion present. No rash.  Neurological:     Mental Status: She is alert.      UC Treatments / Results  Labs (all labs ordered are listed, but only abnormal results are displayed) Labs Reviewed - No data to display  EKG   Radiology No results found.  Procedures Procedures (including critical care time)  Medications Ordered in UC Medications  ketorolac (TORADOL) injection 30 mg (has no administration in time range)    Initial Impression / Assessment and Plan / UC Course  I have reviewed the triage vital signs and the nursing notes.  Pertinent labs & imaging results that were available during my care of the patient were reviewed by me and considered in my medical decision making (see chart for details).     1.  Dog bite to the right elbow. Dog is vaccinated Patient refused Tdap  2.  Cellulitic changes over the right elbow area: Augmentin 875-125 mg orally twice daily for 5 days Ibuprofen 600 mg every 6 hours as needed for pain Daily wound dressing changes If patient notices increasing swelling, purulent discharge, worsening pain she is advised to return to the urgent care to be reevaluated. Toradol 30 mg IM x1 dose.  Final Clinical Impressions(s) / UC Diagnoses   Final diagnoses:  Dog bite, initial encounter   Discharge Instructions  None    ED  Prescriptions    Medication Sig Dispense Auth. Provider   amoxicillin-clavulanate (AUGMENTIN) 875-125 MG tablet Take 1 tablet by mouth every 12 (twelve) hours for 5 days. 10 tablet Rashia Mckesson, Britta Mccreedy, MD   ibuprofen (ADVIL) 600 MG tablet Take 1 tablet (600 mg total) by mouth every 6 (six) hours as needed. 30 tablet Roshini Fulwider, Britta Mccreedy, MD     I have reviewed the PDMP during this encounter.   Merrilee Jansky, MD 06/24/20 [phone]

## 2020-06-24 NOTE — ED Triage Notes (Signed)
Animal control dispatched me through to [employer] to report animal bite. I spoke with Heather at the dept. She said they would have an officer go to the [employer] and she would do a follow up call to the patient.

## 2023-02-24 ENCOUNTER — Other Ambulatory Visit: Payer: Self-pay | Admitting: Family Medicine

## 2023-02-24 DIAGNOSIS — Z1231 Encounter for screening mammogram for malignant neoplasm of breast: Secondary | ICD-10-CM

## 2023-03-18 ENCOUNTER — Ambulatory Visit
Admission: RE | Admit: 2023-03-18 | Discharge: 2023-03-18 | Disposition: A | Discharge status: Home / Self Care | Payer: Medicaid Other | Source: Ambulatory Visit | Attending: Family Medicine | Admitting: Family Medicine

## 2023-03-18 DIAGNOSIS — Z1231 Encounter for screening mammogram for malignant neoplasm of breast: Secondary | ICD-10-CM | POA: Diagnosis not present

## 2023-12-17 ENCOUNTER — Ambulatory Visit
Admission: EM | Admit: 2023-12-17 | Discharge: 2023-12-17 | Disposition: A | Discharge status: Home / Self Care | Payer: Medicaid Other | Attending: Emergency Medicine | Admitting: Emergency Medicine

## 2023-12-17 DIAGNOSIS — J069 Acute upper respiratory infection, unspecified: Secondary | ICD-10-CM | POA: Diagnosis present

## 2023-12-17 DIAGNOSIS — R0789 Other chest pain: Secondary | ICD-10-CM | POA: Insufficient documentation

## 2023-12-17 DIAGNOSIS — J029 Acute pharyngitis, unspecified: Secondary | ICD-10-CM

## 2023-12-17 LAB — RESP PANEL BY RT-PCR (FLU A&B, COVID) ARPGX2
Influenza A by PCR: NEGATIVE
Influenza B by PCR: NEGATIVE
SARS Coronavirus 2 by RT PCR: NEGATIVE

## 2023-12-17 NOTE — ED Triage Notes (Signed)
 Pt c/o back and chest tightness, headache, cough x4days  Pt denies congestion

## 2023-12-17 NOTE — ED Provider Notes (Signed)
 [facility]    CSN: [id number] Arrival date & time: 12/17/23  9173      History   Chief Complaint Chief Complaint  Patient presents with   Chest Pain    HPI Judith Barnes is a 58 y.o. female.   58 year old female, Judith Barnes, presents to urgent care for evaluation of chest tightness/heavyness, back pain, headache, nonproductive cough for 4 days. VSS,afebrile. 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 Using OTC meds for symptom management.   PMH: (Gestational diabetes),denies smoking,drinking or drug use   The history is provided by the patient. No language interpreter was used.    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 Authorizing Provider  ibuprofen  (ADVIL ) 600 MG tablet Take 1 tablet (600 mg total) by mouth every 6 (six) hours as needed. 06/24/20  Yes Lamptey, Aleene KIDD, MD  ondansetron  (ZOFRAN  ODT) 8 MG disintegrating tablet 1/2- 1 tablet q 8 hr prn nausea, vomiting 11/15/19  Yes Mortenson, Ashley, MD  tiZANidine  (ZANAFLEX ) 4 MG tablet Take 1 tablet (4 mg total) by  mouth every 8 (eight) hours as needed for muscle spasms. 11/15/19  Yes Van Knee, MD  fluticasone  (FLONASE ) 50 MCG/ACT nasal spray Place 2 sprays into both nostrils daily. 01/13/19 11/15/19  Lacinda Elsie SQUIBB, PA-C    Family History Family History  Problem Relation Age of Onset   Diabetes Father    Hypertension Father    Graves' disease Sister    Dementia Maternal Grandmother    Cancer Mother        polyp   Breast cancer Paternal Grandmother     Social History Social History   Tobacco Use   Smoking status: Never   Smokeless tobacco: Never  Vaping Use   Vaping status: Never Used  Substance Use Topics   Alcohol use: Never   Drug use: Never     Allergies   Sulfa antibiotics   Review of Systems Review of Systems  Constitutional:  Negative for fever.  Respiratory:  Positive for cough and chest tightness.   Cardiovascular:  Negative for palpitations.  Musculoskeletal:  Positive for back pain and myalgias.  Neurological:  Positive for headaches.  All other systems reviewed and are negative.    Physical Exam Triage  Vital Signs ED Triage Vitals  Encounter Vitals Group     BP 12/17/23 0847 (!) 161/92     Systolic BP Percentile --      Diastolic BP Percentile --      Pulse Rate 12/17/23 0847 76     Resp 12/17/23 0847 16     Temp 12/17/23 0847 98.2 F (36.8 C)     Temp Source 12/17/23 0847 Oral     SpO2 12/17/23 0847 95 %     Weight --      Height 12/17/23 0844 4' 10 (1.473 m)     Head Circumference --      Peak Flow --      Pain Score 12/17/23 0844 6     Pain Loc --      Pain Education --      Exclude from Growth Chart --    No data found.  Updated Vital Signs BP (!) 161/92 (BP Location: Left Arm)   Pulse 76   Temp 98.2 F (36.8 C) (Oral)   Resp 16   Ht 4' 10 (1.473 m)   LMP 08/11/2019   SpO2 95%   BMI 30.31 kg/m   Visual Acuity Right Eye Distance:   Left Eye Distance:   Bilateral Distance:    Right Eye Near:   Left Eye Near:     Bilateral Near:     Physical Exam Vitals and nursing note reviewed.  Constitutional:      General: She is not in acute distress.    Appearance: She is well-developed and well-groomed.  HENT:     Head: Normocephalic and atraumatic.     Right Ear: Tympanic membrane is retracted.     Left Ear: Tympanic membrane is retracted.     Nose: Congestion present.     Mouth/Throat:     Lips: Pink.     Mouth: Mucous membranes are moist.     Pharynx: Oropharynx is clear.  Eyes:     General: Lids are normal.     Conjunctiva/sclera: Conjunctivae normal.     Pupils: Pupils are equal, round, and reactive to light.  Neck:     Trachea: No tracheal deviation.  Cardiovascular:     Rate and Rhythm: Normal rate and regular rhythm.     Pulses: Normal pulses.     Heart sounds: Normal heart sounds. No murmur heard. Pulmonary:     Effort: Pulmonary effort is normal. No respiratory distress.     Breath sounds: Normal breath sounds and air entry.  Abdominal:     General: Bowel sounds are normal.     Palpations: Abdomen is soft.     Tenderness: There is no abdominal tenderness.  Musculoskeletal:        General: No swelling. Normal range of motion.     Cervical back: Normal range of motion and neck supple.  Lymphadenopathy:     Cervical: No cervical adenopathy.  Skin:    General: Skin is warm and dry.     Capillary Refill: Capillary refill takes less than 2 seconds.     Findings: No rash.  Neurological:     General: No focal deficit present.     Mental Status: She is alert and oriented to person, place, and time.     GCS: GCS eye subscore is 4. GCS verbal subscore is 5. GCS motor subscore is 6.  Psychiatric:        Attention and Perception: Attention normal.  Mood and Affect: Mood normal.        Speech: Speech normal.        Behavior: Behavior normal. Behavior is cooperative.      UC Treatments / Results  Labs (all labs ordered are listed, but only abnormal results are  displayed) Labs Reviewed  RESP PANEL BY RT-PCR (FLU A&B, COVID) [id number]    EKG   Radiology No results found.  Procedures Procedures (including critical care time)  Medications Ordered in UC Medications - No data to display  Initial Impression / Assessment and Plan / UC Course  I have reviewed the triage vital signs and the nursing notes.  Pertinent labs & imaging results that were available during my care of the patient were reviewed by me and considered in my medical decision making (see chart for details).  Clinical Course as of 12/17/23 1037  Thu Dec 17, 2023  9142 EKG shows normal sinus rhythm rate 72, QTc 418, no  STEMI noted [JD]    Clinical Course User Index [JD] Cedrica Brune, Rilla, NP   Discussed exam findings and plan of care with patient, referred patient to ER for further evaluation of chest pain, patient verbalized understanding to this provider.  Ddx: Atypical chest pain, Viral URI, musculoskeletal pain Final Clinical Impressions(s) / UC Diagnoses   Final diagnoses:  Atypical chest pain  Viral URI with cough     Discharge Instructions      Go to the ER for further evaluation of chest pain  Your flu and covid test are both negative. Most likely this is a viral illness and will need to run its course,no antibiotics are indicated at present.       ED Prescriptions   None    PDMP not reviewed this encounter.   Aminta Rilla, NP 12/17/23 1037

## 2023-12-17 NOTE — Discharge Instructions (Addendum)
 Go to the ER for further evaluation of chest pain  Your flu and covid test are both negative. Most likely this is a viral illness and will need to run its course,no antibiotics are indicated at present.

## 2024-03-04 ENCOUNTER — Ambulatory Visit
Admission: EM | Admit: 2024-03-04 | Discharge: 2024-03-04 | Disposition: A | Discharge status: Home / Self Care | Attending: Emergency Medicine | Admitting: Emergency Medicine

## 2024-03-04 DIAGNOSIS — J029 Acute pharyngitis, unspecified: Secondary | ICD-10-CM | POA: Diagnosis present

## 2024-03-04 DIAGNOSIS — J302 Other seasonal allergic rhinitis: Secondary | ICD-10-CM | POA: Diagnosis present

## 2024-03-04 LAB — GROUP A STREP BY PCR: Group A Strep by PCR: NOT DETECTED

## 2024-03-04 NOTE — ED Provider Notes (Signed)
 [facility]    CSN: [id number] Arrival date & time: 03/04/24  0915      History   Chief Complaint Chief Complaint  Patient presents with   Sore Throat    HPI Judith Barnes is a 58 y.o. female.   58 year old female, Judith Barnes, presents to urgent care, for evaluation of sore throat and neck itching for 3 days.  Patient denies any rash or swelling.  Patient denies any new lotions soaps medications or insect bites.  The history is provided by the patient. No language interpreter was used.    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 Authorizing Provider  ibuprofen  (ADVIL ) 600 MG tablet Take 1 tablet (600 mg total) by mouth every 6 (six) hours as needed. 06/24/20  Yes Lamptey, Donley Furth, MD  ondansetron  (ZOFRAN  ODT) 8 MG disintegrating tablet 1/2- 1 tablet q 8 hr prn nausea, vomiting 11/15/19  Yes Ethlyn Herd, MD  tiZANidine  (ZANAFLEX ) 4 MG tablet Take 1 tablet (4 mg total) by mouth every 8 (eight) hours as needed for muscle spasms. 11/15/19  Yes  Ethlyn Herd, MD  fluticasone  (FLONASE ) 50 MCG/ACT nasal spray Place 2 sprays into both nostrils daily. 01/13/19 11/15/19  Georgiann Kirsch, PA-C    Family History Family History  Problem Relation Age of Onset   Diabetes Father    Hypertension Father    Graves' disease Sister    Dementia Maternal Grandmother    Cancer Mother        polyp   Breast cancer Paternal Grandmother     Social History Social History   Tobacco Use   Smoking status: Never   Smokeless tobacco: Never  Vaping Use   Vaping status: Never Used  Substance Use Topics   Alcohol use: Never   Drug use: Never     Allergies   Sulfa antibiotics   Review of Systems Review of Systems  Constitutional:  Negative for fever.  HENT:  Positive for sore throat.   Respiratory:  Negative for shortness of breath, wheezing and stridor.   Cardiovascular:  Negative for chest pain and palpitations.  Skin: Negative.   All other systems reviewed and are negative.    Physical Exam Triage Vital  Signs ED Triage Vitals  Encounter Vitals Group     BP 03/04/24 0931 (!) 166/94     Systolic BP Percentile --      Diastolic BP Percentile --      Pulse Rate 03/04/24 0931 91     Resp --      Temp 03/04/24 0931 98.3 F (36.8 C)     Temp Source 03/04/24 0931 Oral     SpO2 03/04/24 0931 95 %     Weight --      Height 03/04/24 0933 5' (1.524 m)     Head Circumference --      Peak Flow --      Pain Score 03/04/24 0933 5     Pain Loc --      Pain Education --      Exclude from Growth Chart --    No data found.  Updated Vital Signs BP (!) 166/94 (BP Location: Left Arm)   Pulse 91   Temp 98.3 F (36.8 C) (Oral)   Ht 5' (1.524 m)   LMP 08/11/2019   SpO2 95%   BMI 28.32 kg/m   Visual Acuity Right Eye Distance:   Left Eye Distance:   Bilateral Distance:    Right Eye Near:   Left Eye Near:    Bilateral Near:     Physical Exam Vitals and nursing note reviewed.  Constitutional:      General: She is not in  acute distress.    Appearance: She is well-developed and well-groomed.  HENT:     Head: Normocephalic.     Right Ear: Tympanic membrane is retracted.     Left Ear: Tympanic membrane is retracted.     Nose: Congestion present.     Mouth/Throat:     Lips: Pink.     Mouth: Mucous membranes are moist.     Pharynx: Uvula midline. Posterior oropharyngeal erythema present.     Tonsils: No tonsillar exudate.  Eyes:     General: Lids are normal.     Conjunctiva/sclera: Conjunctivae normal.     Pupils: Pupils are equal, round, and reactive to light.  Neck:     Trachea: No tracheal deviation.  Cardiovascular:     Rate and Rhythm: Normal rate and regular rhythm.     Heart sounds: Normal heart sounds. No murmur heard. Pulmonary:     Effort: Pulmonary effort is normal.     Breath sounds: Normal breath sounds and air entry.  Abdominal:     General: Bowel sounds are normal.     Palpations: Abdomen is soft.     Tenderness: There is no abdominal tenderness.  Musculoskeletal:        General: Normal range of motion.     Cervical back: Normal range of motion.  Lymphadenopathy:     Cervical: No cervical adenopathy.  Skin:    General: Skin is warm and dry.     Findings: No rash.  Neurological:     General: No focal deficit present.     Mental Status: She is alert and oriented to person, place, and time.     GCS: GCS eye subscore is 4. GCS verbal subscore is 5. GCS motor subscore is 6.  Psychiatric:        Attention and Perception: Attention normal.        Mood and Affect: Mood normal.        Speech: Speech normal.        Behavior: Behavior normal. Behavior is  cooperative.      UC Treatments / Results  Labs (all labs ordered are listed, but only abnormal results are displayed) Labs Reviewed  GROUP A STREP BY PCR    EKG   Radiology No results found.  Procedures Procedures (including critical care time)  Medications Ordered in UC Medications - No data to display  Initial  Impression / Assessment and Plan / UC Course  I have reviewed the triage vital signs and the nursing notes.  Pertinent labs & imaging results that were available during my care of the patient were reviewed by me and considered in my medical decision making (see chart for details).  Clinical Course as of 03/04/24 1826  Fri Mar 04, 2024  0958 Strep ordered for sore throat [JD]  1040 Strep negative [JD]    Clinical Course User Index [JD] Honore Wipperfurth, Eveleen Hinds, NP  Discussed exam findings and plan of care with patient, strict go to ER precautions given.   Patient verbalized understanding to this provider.  Ddx: Viral pharyngitis, seasonal allergies, neck itching,dermatitis Final Clinical Impressions(s) / UC Diagnoses   Final diagnoses:  Viral pharyngitis  Seasonal allergies     Discharge Instructions      Strep test was negative . Rest,push fluids, take OTC allergy med of choice, may use flonase  nasal spray as directed.  Take home meds as prescribed. Follow up with PCP.      ED Prescriptions   None    PDMP not reviewed this encounter.   Peter Brands, NP 03/04/24 [phone]

## 2024-03-04 NOTE — Discharge Instructions (Addendum)
 Strep test was negative . Rest,push fluids, take OTC allergy med of choice, may use flonase  nasal spray as directed.  Take home meds as prescribed. Follow up with PCP.

## 2024-03-04 NOTE — ED Triage Notes (Signed)
 Pt c/o Neck itching x3days  Pt denies a rash or swelling
